# Patient Record
Sex: Female | Born: 1965 | Race: White | Hispanic: No | Marital: Married | Smoking: Never smoker
Health system: Southern US, Community
[De-identification: ages and names within clinical notes are randomized; demographics above are authoritative.]

## PROBLEM LIST (undated history)

## (undated) DIAGNOSIS — K219 Gastro-esophageal reflux disease without esophagitis: Secondary | ICD-10-CM

## (undated) DIAGNOSIS — R7303 Prediabetes: Secondary | ICD-10-CM

## (undated) DIAGNOSIS — E049 Nontoxic goiter, unspecified: Secondary | ICD-10-CM

## (undated) DIAGNOSIS — Z1379 Encounter for other screening for genetic and chromosomal anomalies: Secondary | ICD-10-CM

## (undated) DIAGNOSIS — Z8041 Family history of malignant neoplasm of ovary: Secondary | ICD-10-CM

## (undated) DIAGNOSIS — Z8742 Personal history of other diseases of the female genital tract: Secondary | ICD-10-CM

## (undated) DIAGNOSIS — N915 Oligomenorrhea, unspecified: Secondary | ICD-10-CM

## (undated) DIAGNOSIS — Z87442 Personal history of urinary calculi: Secondary | ICD-10-CM

## (undated) DIAGNOSIS — N739 Female pelvic inflammatory disease, unspecified: Secondary | ICD-10-CM

## (undated) DIAGNOSIS — F419 Anxiety disorder, unspecified: Secondary | ICD-10-CM

## (undated) DIAGNOSIS — Z803 Family history of malignant neoplasm of breast: Secondary | ICD-10-CM

## (undated) HISTORY — DX: Oligomenorrhea, unspecified: N91.5

## (undated) HISTORY — DX: Encounter for other screening for genetic and chromosomal anomalies: Z13.79

## (undated) HISTORY — DX: Family history of malignant neoplasm of ovary: Z80.41

## (undated) HISTORY — DX: Anxiety disorder, unspecified: F41.9

## (undated) HISTORY — DX: Female pelvic inflammatory disease, unspecified: N73.9

## (undated) HISTORY — DX: Gastro-esophageal reflux disease without esophagitis: K21.9

## (undated) HISTORY — DX: Personal history of other diseases of the female genital tract: Z87.42

## (undated) HISTORY — DX: Personal history of urinary calculi: Z87.442

## (undated) HISTORY — PX: DILATION AND CURETTAGE OF UTERUS: SHX78

## (undated) HISTORY — DX: Nontoxic goiter, unspecified: E04.9

## (undated) HISTORY — DX: Family history of malignant neoplasm of breast: Z80.3

## (undated) HISTORY — PX: LITHOTRIPSY: SUR834

## (undated) HISTORY — PX: WISDOM TOOTH EXTRACTION: SHX21

---

## 2007-06-17 ENCOUNTER — Ambulatory Visit: Payer: Self-pay

## 2007-08-18 ENCOUNTER — Observation Stay: Payer: Self-pay

## 2007-08-19 ENCOUNTER — Inpatient Hospital Stay: Payer: Self-pay | Admitting: Unknown Physician Specialty

## 2007-08-20 DIAGNOSIS — O24419 Gestational diabetes mellitus in pregnancy, unspecified control: Secondary | ICD-10-CM

## 2008-11-03 ENCOUNTER — Ambulatory Visit: Payer: Self-pay

## 2009-02-22 ENCOUNTER — Observation Stay: Payer: Self-pay | Admitting: Unknown Physician Specialty

## 2009-08-15 ENCOUNTER — Inpatient Hospital Stay: Payer: Self-pay | Admitting: Obstetrics and Gynecology

## 2009-08-16 DIAGNOSIS — D649 Anemia, unspecified: Secondary | ICD-10-CM

## 2010-10-04 ENCOUNTER — Ambulatory Visit: Payer: Self-pay | Admitting: Obstetrics & Gynecology

## 2010-12-31 DIAGNOSIS — N739 Female pelvic inflammatory disease, unspecified: Secondary | ICD-10-CM

## 2010-12-31 DIAGNOSIS — Z8742 Personal history of other diseases of the female genital tract: Secondary | ICD-10-CM

## 2010-12-31 HISTORY — DX: Female pelvic inflammatory disease, unspecified: N73.9

## 2010-12-31 HISTORY — DX: Personal history of other diseases of the female genital tract: Z87.42

## 2011-06-19 ENCOUNTER — Ambulatory Visit: Payer: Self-pay | Admitting: Family Medicine

## 2011-07-02 ENCOUNTER — Ambulatory Visit: Payer: Self-pay | Admitting: Urology

## 2011-07-19 ENCOUNTER — Ambulatory Visit: Payer: Self-pay | Admitting: Urology

## 2011-08-02 ENCOUNTER — Ambulatory Visit: Payer: Self-pay | Admitting: Urology

## 2011-10-09 ENCOUNTER — Ambulatory Visit: Payer: Self-pay | Admitting: Family Medicine

## 2011-10-22 ENCOUNTER — Ambulatory Visit: Payer: Self-pay | Admitting: Gastroenterology

## 2011-11-05 ENCOUNTER — Ambulatory Visit: Payer: Self-pay | Admitting: Unknown Physician Specialty

## 2011-11-14 ENCOUNTER — Ambulatory Visit: Payer: Self-pay

## 2011-11-19 ENCOUNTER — Ambulatory Visit: Payer: Self-pay | Admitting: Urology

## 2011-12-01 ENCOUNTER — Ambulatory Visit: Payer: Self-pay | Admitting: Unknown Physician Specialty

## 2011-12-20 ENCOUNTER — Ambulatory Visit: Payer: Self-pay | Admitting: Urology

## 2011-12-27 ENCOUNTER — Ambulatory Visit: Payer: Self-pay | Admitting: Urology

## 2012-01-01 HISTORY — PX: COLONOSCOPY: SHX174

## 2012-01-09 ENCOUNTER — Ambulatory Visit: Payer: Self-pay | Admitting: Urology

## 2012-03-20 ENCOUNTER — Ambulatory Visit: Payer: Self-pay | Admitting: Urology

## 2012-08-02 IMAGING — CR DG CHEST 2V
1 series · 2 of 2 positions shown · non-contrast
Comparison: none

REASON FOR EXAM: Chest Pain, SOB
COMMENTS:

PROCEDURE:     DXR - DXR CHEST PA (OR AP) AND LATERAL  - October 09, 2011  [DATE]
RESULT:     Comparison: None.

[Series 1: w chest pa · 0.14mm/px · 2 of 2 slices shown]
[im 1/2]
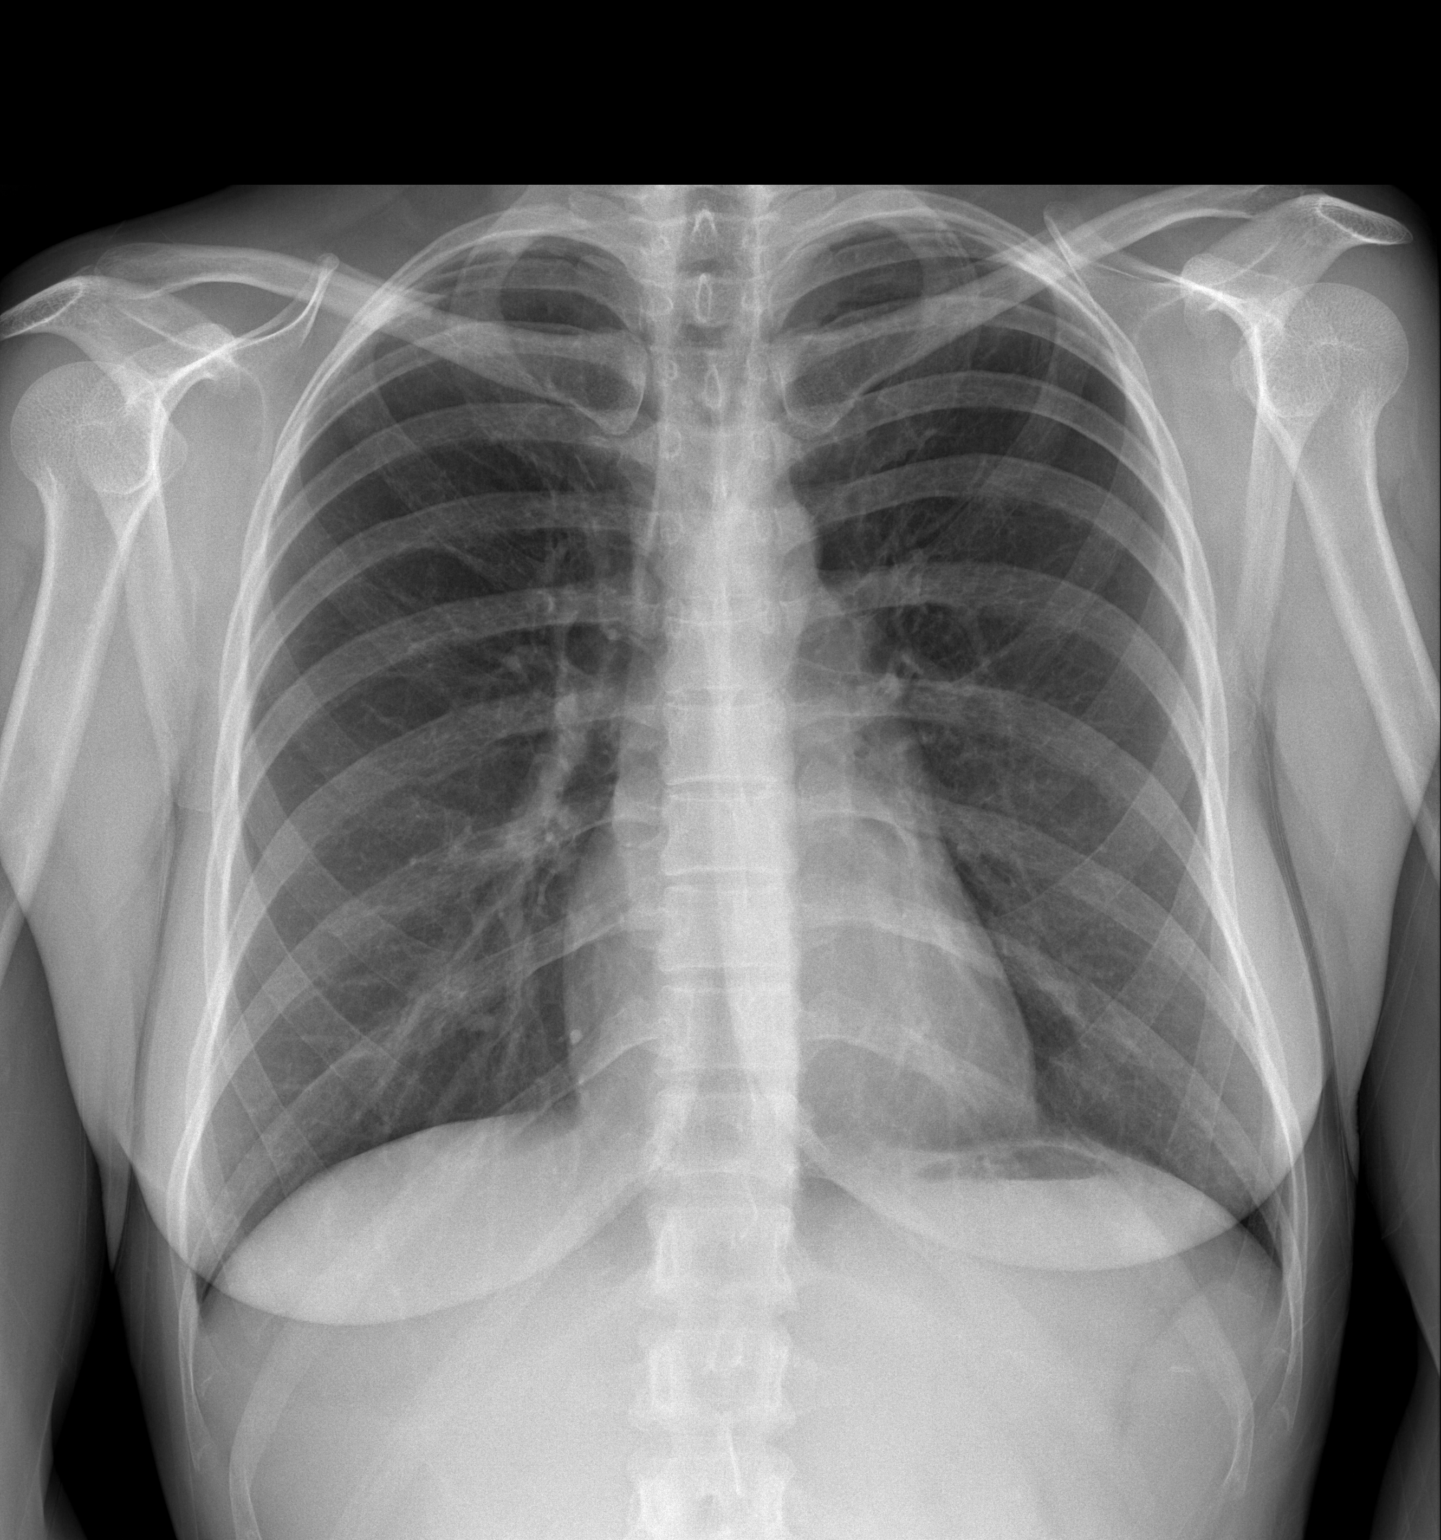
[im 2/2]
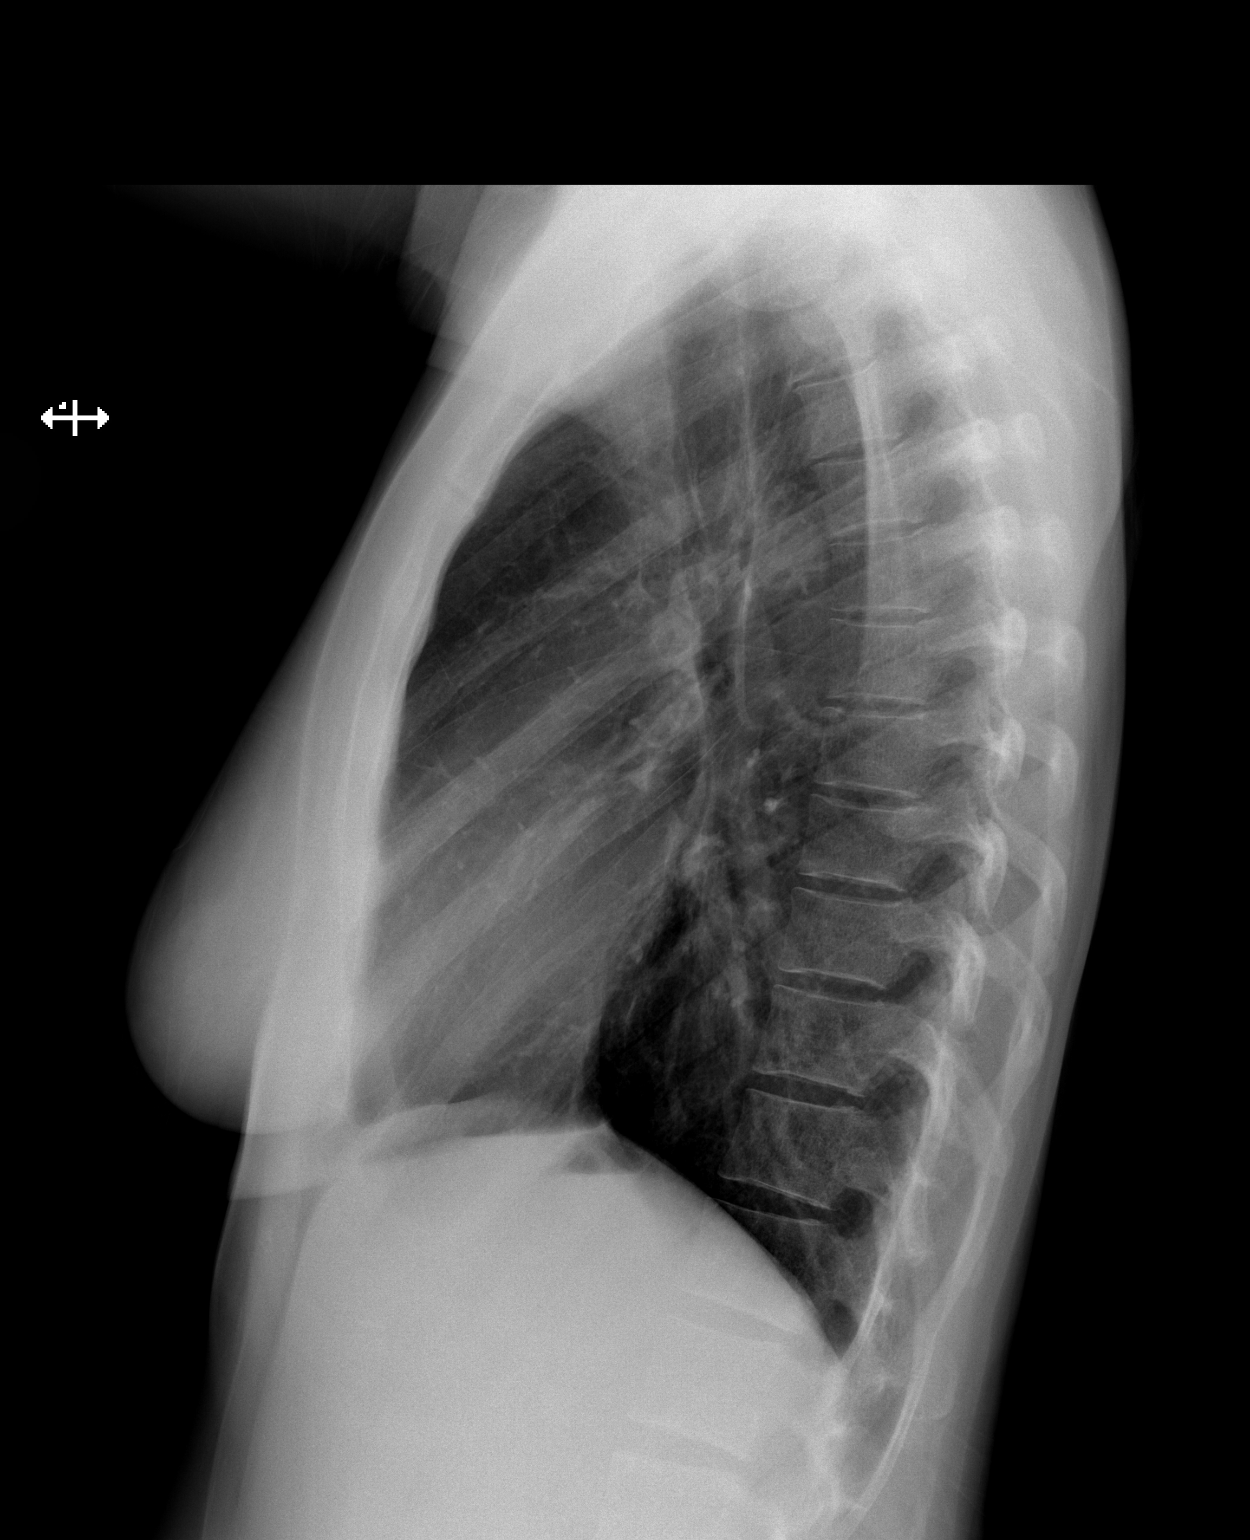

[2 of 2 positions shown; findings below may reference images not displayed]

FINDINGS: Heart and mediastinum are within normal limits. There is borderline mild
hyperinflation. No focal pulmonary opacities.
IMPRESSION: No acute cardiopulmonary disease.

## 2012-08-20 ENCOUNTER — Ambulatory Visit: Payer: Self-pay | Admitting: Urology

## 2012-11-18 ENCOUNTER — Ambulatory Visit: Payer: Self-pay

## 2012-12-01 ENCOUNTER — Ambulatory Visit: Payer: Self-pay | Admitting: Gastroenterology

## 2013-09-09 ENCOUNTER — Ambulatory Visit: Payer: Self-pay | Admitting: Urology

## 2013-11-24 ENCOUNTER — Ambulatory Visit: Payer: Self-pay

## 2014-11-29 ENCOUNTER — Ambulatory Visit: Payer: Self-pay

## 2015-11-07 ENCOUNTER — Other Ambulatory Visit: Payer: Self-pay | Admitting: Certified Nurse Midwife

## 2015-11-07 DIAGNOSIS — Z1231 Encounter for screening mammogram for malignant neoplasm of breast: Secondary | ICD-10-CM

## 2015-12-07 ENCOUNTER — Ambulatory Visit
Admission: RE | Admit: 2015-12-07 | Discharge: 2015-12-07 | Disposition: A | Discharge status: Home / Self Care | Payer: 59 | Source: Ambulatory Visit | Attending: Certified Nurse Midwife | Admitting: Certified Nurse Midwife

## 2015-12-07 ENCOUNTER — Other Ambulatory Visit: Payer: Self-pay | Admitting: Certified Nurse Midwife

## 2015-12-07 DIAGNOSIS — Z1231 Encounter for screening mammogram for malignant neoplasm of breast: Secondary | ICD-10-CM | POA: Diagnosis not present

## 2016-08-31 DIAGNOSIS — Z1379 Encounter for other screening for genetic and chromosomal anomalies: Secondary | ICD-10-CM | POA: Insufficient documentation

## 2016-08-31 HISTORY — DX: Encounter for other screening for genetic and chromosomal anomalies: Z13.79

## 2016-09-15 ENCOUNTER — Emergency Department
Admission: EM | Admit: 2016-09-15 | Discharge: 2016-09-15 | Disposition: A | Discharge status: Home / Self Care | Payer: 59 | Attending: Student in an Organized Health Care Education/Training Program | Admitting: Student in an Organized Health Care Education/Training Program

## 2016-09-15 ENCOUNTER — Encounter: Payer: Self-pay | Admitting: Emergency Medicine

## 2016-09-15 DIAGNOSIS — E860 Dehydration: Secondary | ICD-10-CM | POA: Diagnosis present

## 2016-09-15 HISTORY — DX: Prediabetes: R73.03

## 2016-09-15 LAB — COMPREHENSIVE METABOLIC PANEL
ALBUMIN: 3.7 g/dL (ref 3.5–5.0)
ALK PHOS: 52 U/L (ref 38–126)
ALT: 15 U/L (ref 14–54)
AST: 21 U/L (ref 15–41)
Anion gap: 9 (ref 5–15)
BUN: 17 mg/dL (ref 6–20)
CALCIUM: 8.9 mg/dL (ref 8.9–10.3)
CO2: 21 mmol/L — AB (ref 22–32)
CREATININE: 0.75 mg/dL (ref 0.44–1.00)
Chloride: 105 mmol/L (ref 101–111)
GFR calc Af Amer: >60 mL/min (ref >60)
GFR calc non Af Amer: >60 mL/min (ref >60)
GLUCOSE: 140 mg/dL — AB (ref 65–99)
Potassium: 3.1 mmol/L — ABNORMAL LOW (ref 3.5–5.1)
SODIUM: 135 mmol/L (ref 135–145)
Total Bilirubin: 0.1 mg/dL — ABNORMAL LOW (ref 0.3–1.2)
Total Protein: 6.6 g/dL (ref 6.5–8.1)

## 2016-09-15 LAB — CBC WITH DIFFERENTIAL/PLATELET
Basophils Absolute: 0 10*3/uL (ref 0–0.1)
Basophils Relative: 0 %
EOS PCT: 0 %
Eosinophils Absolute: 0 10*3/uL (ref 0–0.7)
HEMATOCRIT: 28.9 % — AB (ref 35.0–47.0)
Hemoglobin: 10.2 g/dL — ABNORMAL LOW (ref 12.0–16.0)
LYMPHS ABS: 1.3 10*3/uL (ref 1.0–3.6)
LYMPHS PCT: 15 %
MCH: 31.1 pg (ref 26.0–34.0)
MCHC: 35.2 g/dL (ref 32.0–36.0)
MCV: 88.4 fL (ref 80.0–100.0)
MONO ABS: 0.5 10*3/uL (ref 0.2–0.9)
Monocytes Relative: 6 %
Neutro Abs: 6.7 10*3/uL — ABNORMAL HIGH (ref 1.4–6.5)
Neutrophils Relative %: 79 %
PLATELETS: 222 10*3/uL (ref 150–440)
RBC: 3.27 MIL/uL — AB (ref 3.80–5.20)
RDW: 14.7 % — AB (ref 11.5–14.5)
WBC: 8.5 10*3/uL (ref 3.6–11.0)

## 2016-09-15 LAB — CK: Total CK: 69 U/L (ref 38–234)

## 2016-09-15 LAB — TROPONIN I
Troponin I: <0.03 ng/mL (ref <0.03)
Troponin I: <0.03 ng/mL (ref <0.03)

## 2016-09-15 MED ORDER — PROCHLORPERAZINE EDISYLATE 5 MG/ML IJ SOLN
10.0000 mg | Freq: Once | INTRAMUSCULAR | Status: AC
  Administered 2016-09-15: 10 mg via INTRAVENOUS
  Filled 2016-09-15: qty 2

## 2016-09-15 MED ORDER — SODIUM CHLORIDE 0.9 % IV BOLUS (SEPSIS)
1000.0000 mL | Freq: Once | INTRAVENOUS | Status: AC
  Administered 2016-09-15: 1000 mL via INTRAVENOUS

## 2016-09-15 MED ORDER — ONDANSETRON HCL 4 MG/2ML IJ SOLN
INTRAMUSCULAR | Status: AC
  Administered 2016-09-15: 4 mg via INTRAVENOUS
  Filled 2016-09-15: qty 2

## 2016-09-15 MED ORDER — ONDANSETRON HCL 4 MG/2ML IJ SOLN
4.0000 mg | Freq: Once | INTRAMUSCULAR | Status: AC
  Administered 2016-09-15: 4 mg via INTRAVENOUS

## 2016-09-15 MED ORDER — POTASSIUM CHLORIDE CRYS ER 20 MEQ PO TBCR
40.0000 meq | EXTENDED_RELEASE_TABLET | Freq: Once | ORAL | Status: AC
  Administered 2016-09-15: 40 meq via ORAL
  Filled 2016-09-15: qty 2

## 2016-09-15 MED ORDER — DIPHENHYDRAMINE HCL 50 MG/ML IJ SOLN
25.0000 mg | Freq: Once | INTRAMUSCULAR | Status: AC
  Administered 2016-09-15: 25 mg via INTRAVENOUS
  Filled 2016-09-15: qty 1

## 2016-09-15 NOTE — ED Notes (Signed)
Report from alicia, rn.  

## 2016-09-15 NOTE — ED Triage Notes (Signed)
Pt presents to ED via EMS. Ran 5K this morning and states she did not eat or drink much this morning prior to it. C/o headache and nausea. VSS with EMS - 130/82, P95, 98% RA, CBG 152. States she feels "weak all over." Cold a week ago. No c/o dizziness, no LOC.

## 2016-09-15 NOTE — ED Notes (Signed)
Per matt, rn. Pt ambulated without difficulty to restroom.

## 2016-09-15 NOTE — ED Provider Notes (Signed)
[facility] Emergency Department Provider Note    First MD Initiated Contact with Patient 09/15/16 1530     (approximate)  I have reviewed the triage vital signs and the nursing notes.   HISTORY  Chief Complaint Dehydration    HPI Sonia Graves is a 50 y.o. female who presents with generalized weakness as well as nausea and a mild headache this afternoon after she competed in a 5K race this morning. Patient states that she feels very dehydrated and has not been able to keep any oral hydration down today. Denies any shortness of breath or chest pain during the run. States that she is typically very active and does not have any issues with exercise. Denies any new medications. Does have a history of being prediabetic but no known cardiac disease. States that she has felt this way previously when she became very dehydrated and it improved with oral hydration   Past Medical History:  Diagnosis Date  . Prediabetes     There are no active problems to display for this patient.   Past Surgical History:  Procedure Laterality Date  . DILATION AND CURETTAGE OF UTERUS      Prior to Admission medications   Not on File    Allergies Review of patient's allergies indicates no known allergies.  Family History  Problem Relation Age of Onset  . Breast cancer Maternal Aunt     50's  . Breast cancer Cousin 60    maternal    Social History Social History  Substance Use Topics  . Smoking status: Never Smoker  . Smokeless tobacco: Never Used  . Alcohol use No    Review of Systems Patient denies headaches, rhinorrhea, blurry vision, numbness, shortness of breath, chest pain, edema, cough, abdominal pain, nausea, vomiting, diarrhea, dysuria, fevers, rashes or hallucinations unless otherwise stated above in HPI. ____________________________________________   PHYSICAL EXAM:  VITAL SIGNS: Vitals:   09/15/16 1535 09/15/16 1944  BP: (!) 142/114  (!) 113/56  Pulse: 71 68  Resp: 18 16  Temp: 97.6 F (36.4 C)     Constitutional: Alert and oriented. Dehydrated appearing Eyes: Conjunctivae are normal. PERRL. EOMI. Head: Atraumatic. Nose: No congestion/rhinnorhea. Mouth/Throat: Mucous membranes are moist.  Oropharynx non-erythematous. Neck: No stridor. Painless ROM. No cervical spine tenderness to palpation Hematological/Lymphatic/Immunilogical: No cervical lymphadenopathy. Cardiovascular: Normal rate, regular rhythm. Grossly normal heart sounds.  Good peripheral circulation. Respiratory: Normal respiratory effort.  No retractions. Lungs CTAB. Gastrointestinal: Soft and nontender. No distention. No abdominal bruits. No CVA tenderness. Musculoskeletal: No lower extremity tenderness nor edema.  No joint effusions. Neurologic:  Normal speech and language. No gross focal neurologic deficits are appreciated. No gait instability. Skin:  Skin is warm, dry and intact. No rash noted. Psychiatric: Mood and affect are normal. Speech and behavior are normal.  ____________________________________________   LABS (all labs ordered are listed, but only abnormal results are displayed)  Results for orders placed or performed during the hospital encounter of 09/15/16 (from the past 24 hour(s))  Comprehensive metabolic panel     Status: Abnormal   Collection Time: 09/15/16  3:45 PM  Result Value Ref Range   Sodium 135 135 - 145 mmol/L   Potassium 3.1 (L) 3.5 - 5.1 mmol/L   Chloride 105 101 - 111 mmol/L   CO2 21 (L) 22 - 32 mmol/L   Glucose, Bld 140 (H) 65 - 99 mg/dL   BUN 17 6 - 20 mg/dL   Creatinine, Ser 1.61 0.44 -  1.00 mg/dL   Calcium 8.9 8.9 - 11.9 mg/dL   Total Protein 6.6 6.5 - 8.1 g/dL   Albumin 3.7 3.5 - 5.0 g/dL   AST 21 15 - 41 U/L   ALT 15 14 - 54 U/L   Alkaline Phosphatase 52 38 - 126 U/L   Total Bilirubin 0.1 (L) 0.3 - 1.2 mg/dL   GFR calc non Af Amer >60 >60 mL/min   GFR calc Af Amer >60 >60 mL/min   Anion gap 9 5 - 15    Troponin I     Status: None   Collection Time: 09/15/16  3:45 PM  Result Value Ref Range   Troponin I <0.03 <0.03 ng/mL  CK     Status: None   Collection Time: 09/15/16  3:45 PM  Result Value Ref Range   Total CK 69 38 - 234 U/L  CBC with Differential/Platelet     Status: Abnormal   Collection Time: 09/15/16  3:46 PM  Result Value Ref Range   WBC 8.5 3.6 - 11.0 K/uL   RBC 3.27 (L) 3.80 - 5.20 MIL/uL   Hemoglobin 10.2 (L) 12.0 - 16.0 g/dL   HCT 14.7 (L) 82.9 - 56.2 %   MCV 88.4 80.0 - 100.0 fL   MCH 31.1 26.0 - 34.0 pg   MCHC 35.2 32.0 - 36.0 g/dL   RDW 13.0 (H) 86.5 - 78.4 %   Platelets 222 150 - 440 K/uL   Neutrophils Relative % 79 %   Neutro Abs 6.7 (H) 1.4 - 6.5 K/uL   Lymphocytes Relative 15 %   Lymphs Abs 1.3 1.0 - 3.6 K/uL   Monocytes Relative 6 %   Monocytes Absolute 0.5 0.2 - 0.9 K/uL   Eosinophils Relative 0 %   Eosinophils Absolute 0.0 0 - 0.7 K/uL   Basophils Relative 0 %   Basophils Absolute 0.0 0 - 0.1 K/uL  Troponin I     Status: None   Collection Time: 09/15/16  7:04 PM  Result Value Ref Range   Troponin I <0.03 <0.03 ng/mL   ____________________________________________  EKG My review and personal interpretation at Time: 15"39   Indication: weakness  Rate: 75  Rhythm: sinus Axis: normal Other: no acute ischemia ____________________________________________  RADIOLOGY   ____________________________________________   PROCEDURES  Procedure(s) performed: none    Critical Care performed: no ____________________________________________   INITIAL IMPRESSION / ASSESSMENT AND PLAN / ED COURSE  Pertinent labs & imaging results that were available during my care of the patient were reviewed by me and considered in my medical decision making (see chart for details).  DDX: Dehydration, electrolyte abnormality, rhabdomyolysis, heart failure, gastroenteritis, ileus  Sonia Graves is a 50 y.o. who presents to the ED with generalized weakness  and fatigue after running a 5K today. Patient also complaining of headache and nausea. Her abdominal exam is soft and benign.  He has no focal neurodeficits. EKG shows no evidence of acute ischemia or dysrhythmia. Presentation is not particular consistent with ACS and she denies any chest pain or shortness of breath and she was able to run a 5K without any symptoms. Do suspect this is secondary to dehydration as she had poor oral intake before and after the race.  She is afebrile and shows no evidence of heat induced illness. We'll provide IV hydration and check labs  The patient will be placed on continuous pulse oximetry and telemetry for monitoring.  Laboratory evaluation will be sent to evaluate for the above complaints.  Clinical Course  Comment By Time  Patient reassessed and feeling much better after IV fluids. She is tolerating oral hydration. Will replete low potassium. We'll continue to monitor. Willy EddyPatrick Lajoya Dombek, MD 09/16 1735    Patient ambulated about the room asking about discharge home. She has significant improvement in symptoms is able to tolerate oral hydration. She remains hemodynamic stable without any electrolyte abnormality. Repeat troponin negative for any ischemia. I do feel patient is appropriate for outpatient management at this time.  Have discussed with the patient and available family all diagnostics and treatments performed thus far and all questions were answered to the best of my ability. The patient demonstrates understanding and agreement with plan.    ____________________________________________   FINAL CLINICAL IMPRESSION(S) / ED DIAGNOSES  Final diagnoses:  Dehydration      NEW MEDICATIONS STARTED DURING THIS VISIT:  There are no discharge medications for this patient.    Note:  This document was prepared using Dragon voice recognition software and may include unintentional dictation errors.    Willy EddyPatrick Anwitha Mapes, MD 09/15/16 2136

## 2016-10-26 LAB — HM PAP SMEAR: HM PAP: NEGATIVE

## 2016-11-12 ENCOUNTER — Other Ambulatory Visit: Payer: Self-pay | Admitting: Certified Nurse Midwife

## 2016-11-12 DIAGNOSIS — Z1231 Encounter for screening mammogram for malignant neoplasm of breast: Secondary | ICD-10-CM

## 2016-12-10 ENCOUNTER — Ambulatory Visit
Admission: RE | Admit: 2016-12-10 | Discharge: 2016-12-10 | Disposition: A | Discharge status: Home / Self Care | Payer: 59 | Source: Ambulatory Visit | Attending: Certified Nurse Midwife | Admitting: Certified Nurse Midwife

## 2016-12-10 DIAGNOSIS — Z1231 Encounter for screening mammogram for malignant neoplasm of breast: Secondary | ICD-10-CM | POA: Insufficient documentation

## 2016-12-10 LAB — HM MAMMOGRAPHY: HM MAMMO: NORMAL (ref 0–4)

## 2017-10-03 ENCOUNTER — Encounter: Payer: Self-pay | Admitting: *Deleted

## 2017-10-04 ENCOUNTER — Encounter: Payer: Self-pay | Admitting: *Deleted

## 2017-10-04 ENCOUNTER — Ambulatory Visit
Admission: RE | Admit: 2017-10-04 | Discharge: 2017-10-04 | Disposition: A | Discharge status: Home / Self Care | Payer: 59 | Source: Ambulatory Visit | Attending: Gastroenterology | Admitting: Gastroenterology

## 2017-10-04 ENCOUNTER — Ambulatory Visit: Payer: 59 | Admitting: Anesthesiology

## 2017-10-04 ENCOUNTER — Encounter
Admission: RE | Disposition: A | Discharge status: Home / Self Care | Payer: Self-pay | Source: Ambulatory Visit | Attending: Gastroenterology

## 2017-10-04 DIAGNOSIS — Z8371 Family history of colonic polyps: Secondary | ICD-10-CM | POA: Insufficient documentation

## 2017-10-04 DIAGNOSIS — Z79899 Other long term (current) drug therapy: Secondary | ICD-10-CM | POA: Insufficient documentation

## 2017-10-04 DIAGNOSIS — Z1211 Encounter for screening for malignant neoplasm of colon: Secondary | ICD-10-CM | POA: Insufficient documentation

## 2017-10-04 DIAGNOSIS — R7303 Prediabetes: Secondary | ICD-10-CM | POA: Insufficient documentation

## 2017-10-04 DIAGNOSIS — K621 Rectal polyp: Secondary | ICD-10-CM | POA: Insufficient documentation

## 2017-10-04 HISTORY — PX: COLONOSCOPY WITH PROPOFOL: SHX5780

## 2017-10-04 LAB — POCT PREGNANCY, URINE: PREG TEST UR: NEGATIVE

## 2017-10-04 SURGERY — COLONOSCOPY WITH PROPOFOL
Anesthesia: General

## 2017-10-04 MED ORDER — PROPOFOL 10 MG/ML IV BOLUS
INTRAVENOUS | Status: AC
  Filled 2017-10-04: qty 20

## 2017-10-04 MED ORDER — MIDAZOLAM HCL 2 MG/2ML IJ SOLN
INTRAMUSCULAR | Status: AC
  Filled 2017-10-04: qty 2

## 2017-10-04 MED ORDER — FENTANYL CITRATE (PF) 100 MCG/2ML IJ SOLN
INTRAMUSCULAR | Status: AC
  Filled 2017-10-04: qty 2

## 2017-10-04 MED ORDER — LIDOCAINE HCL (PF) 2 % IJ SOLN
INTRAMUSCULAR | Status: AC
  Filled 2017-10-04: qty 10

## 2017-10-04 MED ORDER — PROPOFOL 500 MG/50ML IV EMUL
INTRAVENOUS | Status: AC
  Filled 2017-10-04: qty 50

## 2017-10-04 MED ORDER — SODIUM CHLORIDE 0.9 % IV SOLN: INTRAVENOUS | Status: DC

## 2017-10-04 MED ORDER — MIDAZOLAM HCL 2 MG/2ML IJ SOLN
INTRAMUSCULAR | Status: DC | PRN
  Administered 2017-10-04: 2 mg via INTRAVENOUS

## 2017-10-04 MED ORDER — SODIUM CHLORIDE 0.9 % IV SOLN
INTRAVENOUS | Status: DC
  Administered 2017-10-04 (×3): via INTRAVENOUS

## 2017-10-04 MED ORDER — FENTANYL CITRATE (PF) 100 MCG/2ML IJ SOLN
INTRAMUSCULAR | Status: DC | PRN
  Administered 2017-10-04: 50 ug via INTRAVENOUS

## 2017-10-04 MED ORDER — PHENYLEPHRINE HCL 10 MG/ML IJ SOLN
INTRAMUSCULAR | Status: DC | PRN
  Administered 2017-10-04 (×2): 100 ug via INTRAVENOUS

## 2017-10-04 MED ORDER — PROPOFOL 500 MG/50ML IV EMUL
INTRAVENOUS | Status: DC | PRN
  Administered 2017-10-04: 150 ug/kg/min via INTRAVENOUS

## 2017-10-04 MED ORDER — LIDOCAINE HCL (CARDIAC) 20 MG/ML IV SOLN
INTRAVENOUS | Status: DC | PRN
  Administered 2017-10-04: 60 mg via INTRATRACHEAL

## 2017-10-04 MED ORDER — PROPOFOL 10 MG/ML IV BOLUS
INTRAVENOUS | Status: DC | PRN
  Administered 2017-10-04: 50 mg via INTRAVENOUS

## 2017-10-04 NOTE — Anesthesia Postprocedure Evaluation (Signed)
Anesthesia Post Note  Patient: Sonia Graves  Procedure(s) Performed: COLONOSCOPY WITH PROPOFOL (N/A )  Patient location during evaluation: Endoscopy Anesthesia Type: General Level of consciousness: awake and alert Pain management: pain level controlled Vital Signs Assessment: post-procedure vital signs reviewed and stable Respiratory status: spontaneous breathing, nonlabored ventilation, respiratory function stable and patient connected to nasal cannula oxygen Cardiovascular status: blood pressure returned to baseline and stable Postop Assessment: no apparent nausea or vomiting Anesthetic complications: no     Last Vitals:  Vitals:   10/04/17 1546 10/04/17 1556  BP: 93/68 96/60  Pulse:    Resp:    Temp:    SpO2:      Last Pain:  Vitals:   10/04/17 1526  TempSrc: Tympanic                 Lenard Simmer

## 2017-10-04 NOTE — Transfer of Care (Signed)
Immediate Anesthesia Transfer of Care Note  Patient: Sonia Graves  Procedure(s) Performed: COLONOSCOPY WITH PROPOFOL (N/A )  Patient Location: Endoscopy Unit  Anesthesia Type:General  Level of Consciousness: drowsy and patient cooperative  Airway & Oxygen Therapy: Patient Spontanous Breathing and Patient connected to nasal cannula oxygen  Post-op Assessment: Report given to RN and Post -op Vital signs reviewed and stable  Post vital signs: Reviewed and stable  Last Vitals:  Vitals:   10/04/17 1316 10/04/17 1526  BP: (!) 97/58 (!) 90/42  Pulse: 78 74  Resp: 16 14  Temp: 36.6 C (!) 36.4 C  SpO2: 100% 100%    Last Pain:  Vitals:   10/04/17 1526  TempSrc: Tympanic         Complications: No apparent anesthesia complications

## 2017-10-04 NOTE — Anesthesia Preprocedure Evaluation (Signed)
Anesthesia Evaluation  Patient identified by MRN, date of birth, ID band Patient awake    Reviewed: Allergy & Precautions, NPO status , Patient's Chart, lab work & pertinent test results  History of Anesthesia Complications Negative for: history of anesthetic complications  Airway Mallampati: II  TM Distance: >3 FB Neck ROM: Full    Dental no notable dental hx.    Pulmonary neg pulmonary ROS, neg sleep apnea, neg COPD,    breath sounds clear to auscultation- rhonchi (-) wheezing      Cardiovascular Exercise Tolerance: Good (-) hypertension(-) CAD and (-) Past MI  Rhythm:Regular Rate:Normal - Systolic murmurs and - Diastolic murmurs    Neuro/Psych negative neurological ROS  negative psych ROS   GI/Hepatic negative GI ROS, Neg liver ROS,   Endo/Other  negative endocrine ROSneg diabetes  Renal/GU negative Renal ROS     Musculoskeletal negative musculoskeletal ROS (+)   Abdominal (+) - obese,   Peds  Hematology negative hematology ROS (+)   Anesthesia Other Findings   Reproductive/Obstetrics                             Anesthesia Physical Anesthesia Plan  ASA: I  Anesthesia Plan: General   Post-op Pain Management:    Induction: Intravenous  PONV Risk Score and Plan: 2 and Propofol infusion  Airway Management Planned: Natural Airway  Additional Equipment:   Intra-op Plan:   Post-operative Plan:   Informed Consent: I have reviewed the patients History and Physical, chart, labs and discussed the procedure including the risks, benefits and alternatives for the proposed anesthesia with the patient or authorized representative who has indicated his/her understanding and acceptance.     Dental advisory given  Plan Discussed with: CRNA and Anesthesiologist  Anesthesia Plan Comments:         Anesthesia Quick Evaluation  

## 2017-10-04 NOTE — H&P (Signed)
Outpatient short stay form Pre-procedure 10/04/2017 2:41 PM Lollie Sails MD  Primary Physician: Dr. Adella Hare  Reason for visit:  Colonoscopy  History of present illness:  Patient is a 51 year old female presenting today as above. There is a family history of colon polyps in both of her parents. Her last colonoscopy was in 2013 for Hemoccult-positive stool that was otherwise negative. He has no issues with diarrhea. She tolerated her prep well. She takes no aspirin or blood thinning agents.    Current Facility-Administered Medications:  .  0.9 %  sodium chloride infusion, , Intravenous, Continuous, Lollie Sails, MD, Last Rate: 20 mL/hr at 10/04/17 1339 .  0.9 %  sodium chloride infusion, , Intravenous, Continuous, Lollie Sails, MD  Prescriptions Prior to Admission  Medication Sig Dispense Refill Last Dose  . blood glucose meter kit and supplies by Other route as directed. Dispense based on patient and insurance preference. Use up to four times daily as directed. (FOR ICD-9 250.00, 250.01).     . polyethylene glycol powder (GLYCOLAX/MIRALAX) powder Take 1 Container by mouth once.     . pantoprazole (PROTONIX) 40 MG tablet Take 40 mg by mouth daily.   Not Taking at Unknown time     No Known Allergies   Past Medical History:  Diagnosis Date  . Prediabetes     Review of systems:      Physical Exam    Heart and lungs: Regular rate and rhythm without rub or gallop, lungs are bilaterally clear.    HEENT: Normocephalic atraumatic eyes are anicteric    Other:     Pertinant exam for procedure: Soft nontender nondistended bowel sounds positive normoactive.    Planned proceedures: Colonoscopy and indicated procedures. I have discussed the risks benefits and complications of procedures to include not limited to bleeding, infection, perforation and the risk of sedation and the patient wishes to proceed.    Lollie Sails, MD Gastroenterology 10/04/2017  2:41  PM

## 2017-10-04 NOTE — Anesthesia Post-op Follow-up Note (Signed)
Anesthesia QCDR form completed.        

## 2017-10-04 NOTE — Op Note (Signed)
[facility] Gastroenterology Patient Name: Sonia Graves Procedure Date: 10/04/2017 2:43 PM MRN: [id number] Account #: [id number] Date of Birth: [birth year] Admit Type: Outpatient Age: 51 Room: [facility] ENDO ROOM 4 Gender: Female Note Status: Finalized Procedure:            Colonoscopy Indications:          Family history of colonic polyps in a first-degree                        relative Providers:            Christena Deem, MD Referring MD:         No Local Md, MD (Referring MD) Medicines:            Monitored Anesthesia Care Complications:        No immediate complications. Procedure:            Pre-Anesthesia Assessment:                       - ASA Grade Assessment: I - A normal, healthy patient.                       After obtaining informed consent, the colonoscope was                        passed under direct vision. Throughout the procedure,                        the patient's blood pressure, pulse, and oxygen                        saturations were monitored continuously. The Olympus                        PCF-H180AL colonoscope ( S#: [id number] ) was introduced                        through the anus and advanced to the the cecum,                        identified by appendiceal orifice and ileocecal valve.                        The quality of the bowel preparation was good. Findings:      A 1 mm polyp was found in the distal descending colon. The polyp was       sessile. The polyp was removed with a cold biopsy forceps. Resection and       retrieval were complete.      A 3 mm polyp was found in the rectum. The polyp was sessile. The polyp       was removed with a cold biopsy forceps. Resection and retrieval were       complete.      The digital rectal exam was normal. Impression:           - One 1 mm polyp in the distal descending colon,                        removed with a cold biopsy forceps. Resected and  retrieved.                 - One 3 mm polyp in the rectum, removed with a cold                        biopsy forceps. Resected and retrieved. Recommendation:       - Discharge patient to home.                       - Await pathology results.                       - Telephone GI clinic for pathology results in 1 week. Procedure Code(s):    --- Professional ---                       [phone], Colonoscopy, flexible; with biopsy, single or                        multiple Diagnosis Code(s):    --- Professional ---                       D12.4, Benign neoplasm of descending colon                       K62.1, Rectal polyp                       Z83.71, Family history of colonic polyps CPT copyright 2016 [employer]. All rights reserved. The codes documented in this report are preliminary and upon coder review may  be revised to meet current compliance requirements. Christena Deem, MD 10/04/2017 3:24:03 PM This report has been signed electronically. Number of Addenda: 0 Note Initiated On: 10/04/2017 2:43 PM Scope Withdrawal Time: 0 hours 12 minutes 48 seconds  Total Procedure Duration: 0 hours 28 minutes 8 seconds       [facility]

## 2017-10-07 ENCOUNTER — Encounter: Payer: Self-pay | Admitting: Gastroenterology

## 2017-10-08 LAB — SURGICAL PATHOLOGY

## 2017-10-31 ENCOUNTER — Encounter: Payer: Self-pay | Admitting: Certified Nurse Midwife

## 2017-10-31 ENCOUNTER — Ambulatory Visit (INDEPENDENT_AMBULATORY_CARE_PROVIDER_SITE_OTHER): Payer: 59 | Admitting: Certified Nurse Midwife

## 2017-10-31 VITALS — BP 104/62 | HR 78 | Ht 60.0 in | Wt 118.0 lb

## 2017-10-31 DIAGNOSIS — F419 Anxiety disorder, unspecified: Secondary | ICD-10-CM

## 2017-10-31 DIAGNOSIS — Z1379 Encounter for other screening for genetic and chromosomal anomalies: Secondary | ICD-10-CM

## 2017-10-31 DIAGNOSIS — Z23 Encounter for immunization: Secondary | ICD-10-CM | POA: Diagnosis not present

## 2017-10-31 DIAGNOSIS — Z1329 Encounter for screening for other suspected endocrine disorder: Secondary | ICD-10-CM

## 2017-10-31 DIAGNOSIS — Z124 Encounter for screening for malignant neoplasm of cervix: Secondary | ICD-10-CM

## 2017-10-31 DIAGNOSIS — Z8041 Family history of malignant neoplasm of ovary: Secondary | ICD-10-CM | POA: Diagnosis not present

## 2017-10-31 DIAGNOSIS — Z803 Family history of malignant neoplasm of breast: Secondary | ICD-10-CM

## 2017-10-31 DIAGNOSIS — Z1231 Encounter for screening mammogram for malignant neoplasm of breast: Secondary | ICD-10-CM | POA: Diagnosis not present

## 2017-10-31 DIAGNOSIS — Z87898 Personal history of other specified conditions: Secondary | ICD-10-CM

## 2017-10-31 DIAGNOSIS — Z1239 Encounter for other screening for malignant neoplasm of breast: Secondary | ICD-10-CM

## 2017-10-31 NOTE — Progress Notes (Signed)
Gynecology Annual Exam  PCP: Patient, No Pcp Per  Chief Complaint:  Chief Complaint  Patient presents with  . Gynecologic Exam    History of Present Illness:Sonia Graves is a 51 year old Caucasian/White female , G 3 P 2 0 1 2 , who presents for her annual exam. She is not having any gyn concerns.  Her menses were irregular for the last half of 2017, but have returned to coming monthly and  lasting 5 days with a  medium flow. Mild hot flashes have abated. She does admit to some occasional headaches and irritability with menses. No IMB  She denies dysmenorrhea.  The patient's past medical history is notable for a history of glucose intolerance and gestational diabetes..  Since her last annual GYN exam dated 10/26/2016, she has had a colonoscopy for a family history of colon polyps which was normal. Her next colonoscopy is due in 5 years.  She desires hemoglobin A1C testing.  She is sexually active. She is currently using a vasectomy for contraception.  Her most recent pap smear was obtained 10/26/16 and was negative.  Her most recent mammogram obtained on 12/10/2016 was normal.  There is a positive history of breast cancer in her maternal cousin and maternal aunt . Genetic testing has been done. The patient had MYRISK testing 2017 that was negative. Patient's IBIS risk calculated in 2017 for breast cancer is 13.4%.  There is a family history of ovarian cancer in her paternal aunt. Genetic testing has been done. She tested negative for MYRISK..  The patient does do occasional self breast exams.  The patient does not smoke.  The patient does drink infrequently.  The patient does not use illegal drugs.  The patient usu exercises regularly.  The patient does get adequate calcium in her diet and with her supplement.  She had a recent cholesterol screen in 2018 that was normal.     The patient denies current symptoms of depression.    Review of Systems: Review of Systems    Constitutional: Negative for chills, fever and weight loss.  HENT: Negative for congestion, sinus pain and sore throat.   Eyes: Negative for blurred vision and pain.  Respiratory: Negative for hemoptysis, shortness of breath and wheezing.   Cardiovascular: Positive for palpitations (occasional). Negative for chest pain and leg swelling.  Gastrointestinal: Negative for abdominal pain, blood in stool, diarrhea, heartburn, nausea and vomiting.  Genitourinary: Negative for dysuria, frequency, hematuria and urgency.  Musculoskeletal: Negative for back pain, joint pain and myalgias.  Skin: Negative for itching and rash.  Neurological: Negative for dizziness, tingling and headaches.  Endo/Heme/Allergies: Negative for environmental allergies and polydipsia. Does not bruise/bleed easily.       Negative for hirsutism   Psychiatric/Behavioral: Negative for depression. The patient is not nervous/anxious and does not have insomnia.     Past Medical History:  Past Medical History:  Diagnosis Date  . Anxiety   . Esophageal reflux   . Family history of breast cancer    IBIS risk 13.4% by [facility]   . Family history of ovarian cancer   . Genetic testing of female 08/2016   MyRisk Negative  . Goiter   . History of abnormal cervical Pap smear 2012   ascus  . History of kidney stones   . Inflammatory disease of cervix, vagina, and vulva 2012  . Oligomenorrhea   . Prediabetes     Past Surgical History:  Past Surgical History:  Procedure Laterality  Date  . COLONOSCOPY  2013   had heme positive FIT test  . COLONOSCOPY WITH PROPOFOL N/A 10/04/2017   Procedure: COLONOSCOPY WITH PROPOFOL;  Surgeon: Lollie Sails, MD;  Location: [facility] ENDOSCOPY;  Service: Endoscopy;  Laterality: N/A;  . DILATION AND CURETTAGE OF UTERUS    . LITHOTRIPSY    . WISDOM TOOTH EXTRACTION      Family History:  Family History  Problem Relation Age of Onset  . Breast cancer Maternal Aunt 74       died from breast  cancer  . Diabetes Maternal Aunt        maternal aunts x3  . Heart disease Maternal Aunt   . Breast cancer Cousin 60       maternal  . Diabetes Cousin   . Heart disease Mother        mitral valve replacement  . Hypertension Mother   . Thyroid disease Mother   . Squamous cell carcinoma Mother 53  . Prostate cancer Father 63  . Esophageal cancer Maternal Grandmother   . Colon cancer Paternal Uncle 79  . Ovarian cancer Paternal Aunt 68  . Pancreatic cancer Cousin        son of aunt with ovarian cancer    Social History:  Social History   Socioeconomic History  . Marital status: Married    Spouse name: Not on file  . Number of children: 2  . Years of education: Not on file  . Highest education level: Bachelor's degree (e.g., BA, AB, BS)  Social Needs  . Financial resource strain: Not on file  . Food insecurity - worry: Not on file  . Food insecurity - inability: Not on file  . Transportation needs - medical: Not on file  . Transportation needs - non-medical: Not on file  Occupational History  . Occupation: Homemaker/ Part time IT  Tobacco Use  . Smoking status: Never Smoker  . Smokeless tobacco: Never Used  Substance and Sexual Activity  . Alcohol use: Yes    Comment: occasional  . Drug use: No  . Sexual activity: Yes    Birth control/protection: Other-see comments    Comment: vasectomy   Other Topics Concern  . Not on file  Social History Narrative  . Not on file    Allergies:  No Known Allergies  Medications: Current Outpatient Medications on File Prior to Visit  Medication Sig Dispense Refill  . Blood Glucose Monitoring Suppl (FIFTY50 GLUCOSE METER 2.0) w/Device KIT Use as directed.    . Clocortolone Pivalate (CLODERM) 0.1 % cream     . glucose blood test strip      No current facility-administered medications on file prior to visit.   Multivitamin Vitamin D3   Physical Exam Vitals: BP 104/62   Pulse 78   Ht 5' (1.524 m)   Wt 118 lb (53.5 kg)    LMP 10/25/2017 (Exact Date)   BMI 23.05 kg/m   General: WF in NAD HEENT: normocephalic, anicteric Neck: no thyroid enlargement, no palpable nodules, no cervical lymphadenopathy  Pulmonary: No increased work of breathing, CTAB Cardiovascular: RRR, without murmur  Breast: Breast symmetrical, no tenderness, no palpable nodules or masses, no skin or nipple retraction present, no nipple discharge.  No axillary, infraclavicular or supraclavicular lymphadenopathy. Abdomen: Soft, non-tender, non-distended.  Umbilicus without lesions.  No hepatomegaly or masses palpable. No evidence of hernia. Genitourinary:  External: Normal external female genitalia.  Normal urethral meatus, normal Bartholin's and Skene's glands.    Vagina: Normal  vaginal mucosa, no evidence of prolapse.    Cervix: Grossly normal in appearance, no bleeding, non-tender  Uterus: Anteverted, normal size, shape, and consistency, mobile, and non-tender  Adnexa: No adnexal masses, non-tender  Rectal: deferred  Lymphatic: no evidence of inguinal lymphadenopathy Extremities: no edema, erythema, or tenderness Neurologic: Grossly intact Psychiatric: mood appropriate, affect full     Assessment: 51 y.o. annual gyn exam.  Plan:  1) Breast cancer screening - recommend monthly self breast exams and annual screening mammograms. Mammogram was ordered today. Patient to call for appt at [facility]  2) Colonoscopy-UTD. Next due in 5 years  3) Cervical cancer screening - Pap was done.  4) Osteoporosis-discussed calcium and vitamin D3 requirements. DIscussed perimenopausal bleeding changes  5) TSH and hemoglobin A1C ordered. Flu vaccine today  6) RTO 1 year and prn.   Dalia Heading, CNM

## 2017-11-05 LAB — IGP, APTIMA HPV
HPV APTIMA: NEGATIVE
PAP SMEAR COMMENT: 0

## 2017-11-26 ENCOUNTER — Encounter: Payer: Self-pay | Admitting: Certified Nurse Midwife

## 2017-11-26 DIAGNOSIS — Z87442 Personal history of urinary calculi: Secondary | ICD-10-CM | POA: Insufficient documentation

## 2017-11-26 DIAGNOSIS — R7303 Prediabetes: Secondary | ICD-10-CM | POA: Insufficient documentation

## 2017-11-26 DIAGNOSIS — Z8041 Family history of malignant neoplasm of ovary: Secondary | ICD-10-CM | POA: Insufficient documentation

## 2017-11-26 DIAGNOSIS — Z803 Family history of malignant neoplasm of breast: Secondary | ICD-10-CM | POA: Insufficient documentation

## 2017-11-26 DIAGNOSIS — F419 Anxiety disorder, unspecified: Secondary | ICD-10-CM | POA: Insufficient documentation

## 2017-11-26 DIAGNOSIS — K219 Gastro-esophageal reflux disease without esophagitis: Secondary | ICD-10-CM | POA: Insufficient documentation

## 2017-12-16 ENCOUNTER — Ambulatory Visit
Admission: RE | Admit: 2017-12-16 | Discharge: 2017-12-16 | Disposition: A | Discharge status: Home / Self Care | Payer: 59 | Source: Ambulatory Visit | Attending: Certified Nurse Midwife | Admitting: Certified Nurse Midwife

## 2017-12-16 DIAGNOSIS — Z1239 Encounter for other screening for malignant neoplasm of breast: Secondary | ICD-10-CM

## 2017-12-16 DIAGNOSIS — Z1231 Encounter for screening mammogram for malignant neoplasm of breast: Secondary | ICD-10-CM | POA: Diagnosis present

## 2018-04-21 ENCOUNTER — Other Ambulatory Visit: Payer: Managed Care, Other (non HMO)

## 2018-04-21 DIAGNOSIS — Z87898 Personal history of other specified conditions: Secondary | ICD-10-CM

## 2018-04-21 DIAGNOSIS — Z1329 Encounter for screening for other suspected endocrine disorder: Secondary | ICD-10-CM

## 2018-04-22 LAB — TSH: TSH: 0.841 u[IU]/mL (ref 0.450–4.500)

## 2018-04-22 LAB — HGB A1C W/O EAG: HEMOGLOBIN A1C: 5.5 % (ref 4.8–5.6)

## 2018-04-28 ENCOUNTER — Encounter (INDEPENDENT_AMBULATORY_CARE_PROVIDER_SITE_OTHER): Payer: Self-pay

## 2018-11-19 ENCOUNTER — Ambulatory Visit: Payer: Managed Care, Other (non HMO) | Admitting: Certified Nurse Midwife

## 2018-11-23 NOTE — Progress Notes (Addendum)
Gynecology Annual Exam  PCP: Patient, No Pcp Per  Chief Complaint:  Chief Complaint  Patient presents with  . Gynecologic Exam    would like flu shot today    History of Present Illness:Sonia Graves is a 52 year old Caucasian/White female , G 3 P 2 0 1 2 , who presents for her annual exam. She is not having any gyn concerns.  Her menses are irregular, are every 4-6 weeks, and last 4-5 days with a  medium flow. Mild hot flashes have abated. She does admit to some premenstraul headaches, breast tenderness and irritability with menses. No IMB. Her LMP was 10/31/2018 She denies dysmenorrhea.  The patient's past medical history is notable for a history of glucose intolerance and gestational diabetes.. Her last hemoglobin A1C in August  2019 was 5.7%. Would like another hemoglobin A1C early next year.  Since her last annual GYN exam dated 10/31/2017, she has been diagnosed with seborrheic dermatitis and was given a steroid cream for her nose and eyebrows. Has also noticed a spot on her vulva that itches.  She had a colonoscopy for a family history of colon polyps in 10/04/2017. Had one hyperplastic polyp removed.  Her next colonoscopy is due in 5 years.  She is sexually active. She is currently using a vasectomy for contraception.  Her most recent pap smear was obtained 10/31/2017 and was NIL/negative HRHPV. Her most recent mammogram obtained on 12/16/2017 was normal Birad 1 c There is a positive history of breast cancer in her maternal cousin and maternal aunt . Genetic testing has been done. The patient had MYRISK testing 2017 that was negative. Patient's IBIS risk calculated in 2017 for breast cancer is 13.4%.  There is a family history of ovarian cancer in her paternal aunt. Genetic testing has been done. The patient  tested negative for MYRISK.  The patient does do occasional self breast exams.  The patient does not smoke.  The patient does drink infrequently.  The patient does not  use illegal drugs.  The patient has been exercising by walking. Trying to incorporate more exercise into her routine. The patient does get adequate calcium in her diet. She had a recent cholesterol screen in 2019 that was borderline elevated with a normal HDL=69  She also found out she was not immune to rubeola and mumps.    The patient denies current symptoms of depression.    Review of Systems: Review of Systems  Constitutional: Negative for chills, fever and weight loss.  HENT: Negative for congestion, sinus pain and sore throat.   Eyes: Negative for blurred vision and pain.  Respiratory: Negative for hemoptysis, shortness of breath and wheezing.   Cardiovascular: Negative for chest pain and leg swelling. Palpitations: occasional.  Gastrointestinal: Negative for abdominal pain, blood in stool, diarrhea, heartburn, nausea and vomiting.  Genitourinary: Negative for dysuria, frequency, hematuria and urgency.  Musculoskeletal: Negative for back pain, joint pain and myalgias.  Skin: Negative for itching and rash.  Neurological: Negative for dizziness, tingling and headaches.  Endo/Heme/Allergies: Negative for environmental allergies and polydipsia. Does not bruise/bleed easily.       Negative for hirsutism   Psychiatric/Behavioral: Negative for depression. The patient is not nervous/anxious and does not have insomnia.   Breasts: positive for tenderness  Past Medical History:  Past Medical History:  Diagnosis Date  . Anxiety   . Esophageal reflux   . Family history of breast cancer    IBIS risk 13.4% by [facility]   .  Family history of ovarian cancer   . Genetic testing of female 08/2016   MyRisk Negative  . Goiter   . History of abnormal cervical Pap smear 2012   ascus  . History of kidney stones   . Inflammatory disease of cervix, vagina, and vulva 2012  . Oligomenorrhea   . Prediabetes     Past Surgical History:  Past Surgical History:  Procedure Laterality Date  .  COLONOSCOPY  2013   had heme positive FIT test  . COLONOSCOPY WITH PROPOFOL N/A 10/04/2017   Procedure: COLONOSCOPY WITH PROPOFOL;  Surgeon: Lollie Sails, MD;  Location: [facility] ENDOSCOPY;  Service: Endoscopy;  Laterality: N/A;  . DILATION AND CURETTAGE OF UTERUS    . LITHOTRIPSY    . WISDOM TOOTH EXTRACTION      Family History:  Family History  Problem Relation Age of Onset  . Breast cancer Maternal Aunt 12       died from breast cancer  . Diabetes Maternal Aunt        TYPE2  . Heart disease Maternal Aunt   . Breast cancer Cousin 60       maternal  . Diabetes Cousin        TYPE1  . Heart disease Mother        mitral valve replacement  . Hypertension Mother   . Thyroid disease Mother   . Squamous cell carcinoma Mother 69  . Cancer Mother 28       BOWEN'S DISEASE  . Prostate cancer Father 79  . Esophageal cancer Maternal Grandmother   . Colon cancer Paternal Uncle 74  . Ovarian cancer Paternal Aunt 18       + BRCA 2  . Pancreatic cancer Cousin        son of aunt with ovarian cancer  . Diabetes Maternal Aunt        TYPE2  . Diabetes Maternal Aunt        TYPE2  . Melanoma Cousin        daughter of aunt with ovarian cancer    Social History:  Social History   Socioeconomic History  . Marital status: Married    Spouse name: Not on file  . Number of children: 2  . Years of education: 16  . Highest education level: Bachelor's degree (e.g., BA, AB, BS)  Occupational History  . Occupation: Homemaker/ Part time IT  Social Needs  . Financial resource strain: Not on file  . Food insecurity:    Worry: Not on file    Inability: Not on file  . Transportation needs:    Medical: Not on file    Non-medical: Not on file  Tobacco Use  . Smoking status: Never Smoker  . Smokeless tobacco: Never Used  Substance and Sexual Activity  . Alcohol use: Yes    Comment: occasional  . Drug use: No  . Sexual activity: Yes    Birth control/protection: Other-see comments     Comment: vasectomy   Lifestyle  . Physical activity:    Days per week: Not on file    Minutes per session: Not on file  . Stress: Not on file  Relationships  . Social connections:    Talks on phone: Not on file    Gets together: Not on file    Attends religious service: Not on file    Active member of club or organization: Not on file    Attends meetings of clubs or organizations: Not on file  Relationship status: Not on file  . Intimate partner violence:    Fear of current or ex partner: Not on file    Emotionally abused: Not on file    Physically abused: Not on file    Forced sexual activity: Not on file  Other Topics Concern  . Not on file  Social History Narrative  . Not on file    Allergies:  No Known Allergies  Medications: Current Outpatient Medications on File Prior to Visit  Medication Sig Dispense Refill  . Clocortolone Pivalate (CLODERM) 0.1 % cream      No current facility-administered medications on file prior to visit.   Multivitamin Vitamin D3   Physical Exam Vitals: BP (!) 120/50   Pulse 90   Ht 5' (1.524 m)   Wt 125 lb (56.7 kg)   LMP 10/31/2018 (Exact Date)   BMI 24.41 kg/m   General: WF in NAD HEENT: normocephalic, anicteric Neck: no thyroid enlargement, no palpable nodules, no cervical lymphadenopathy  Pulmonary: No increased work of breathing, CTAB Cardiovascular: RRR, without murmur  Breast: Breast symmetrical, no tenderness, no palpable nodules or masses, no skin or nipple retraction present, no nipple discharge.  No axillary, infraclavicular or supraclavicular lymphadenopathy. Abdomen: Soft, non-tender, non-distended.  Umbilicus without lesions.  No hepatomegaly or masses palpable. No evidence of hernia. Genitourinary:  External: Erythema of labia majora and small irritated macular lesion on left lower labia majora.  Normal urethral meatus, normal Bartholin's and Skene's glands.    Vagina: Normal vaginal mucosa, no evidence of  prolapse.    Cervix: Grossly normal in appearance, no bleeding, non-tender  Uterus: Anteverted, normal size, shape, and consistency, mobile, and non-tender  Adnexa: No adnexal masses, non-tender  Rectal: deferred  Lymphatic: no evidence of inguinal lymphadenopathy Extremities: no edema, erythema, or tenderness Neurologic: Grossly intact Psychiatric: mood appropriate, affect full     Assessment: 52 y.o. annual gyn exam. Vulvar irritation Elevated hemoglobin A1C/ prediabetes.  Plan:  1) Breast cancer screening - recommend monthly self breast exams and annual screening mammograms. Mammogram was ordered today. Patient to call for appt at [facility]  2) Colonoscopy-UTD. Next due in 5 years  3) Cervical cancer screening - Pap was done.  4) Osteoporosis-discussed calcium and vitamin D3 requirements. DIscussed perimenopausal bleeding changes  5) Flu vaccine today. Hemoglobin a1C early next year. RX for MMR given. RX for kenalog 0.1 % ointment -can apply to vulva BID prn.   6) RTO 1 year and prn.   Dalia Heading, CNM

## 2018-11-24 ENCOUNTER — Encounter: Payer: Self-pay | Admitting: Certified Nurse Midwife

## 2018-11-24 ENCOUNTER — Ambulatory Visit (INDEPENDENT_AMBULATORY_CARE_PROVIDER_SITE_OTHER): Payer: Managed Care, Other (non HMO) | Admitting: Certified Nurse Midwife

## 2018-11-24 VITALS — BP 120/50 | HR 90 | Ht 60.0 in | Wt 125.0 lb

## 2018-11-24 DIAGNOSIS — Z23 Encounter for immunization: Secondary | ICD-10-CM | POA: Diagnosis not present

## 2018-11-24 DIAGNOSIS — Z01419 Encounter for gynecological examination (general) (routine) without abnormal findings: Secondary | ICD-10-CM | POA: Diagnosis not present

## 2018-11-24 DIAGNOSIS — Z1239 Encounter for other screening for malignant neoplasm of breast: Secondary | ICD-10-CM

## 2018-11-24 DIAGNOSIS — Z87898 Personal history of other specified conditions: Secondary | ICD-10-CM

## 2018-11-24 DIAGNOSIS — Z124 Encounter for screening for malignant neoplasm of cervix: Secondary | ICD-10-CM

## 2018-11-24 MED ORDER — TRIAMCINOLONE ACETONIDE 0.1 % EX OINT: 1.0000 "application " | TOPICAL_OINTMENT | Freq: Two times a day (BID) | CUTANEOUS | 0 refills | Status: AC

## 2018-11-26 ENCOUNTER — Encounter: Payer: Self-pay | Admitting: Certified Nurse Midwife

## 2018-11-26 LAB — IGP,RFX APTIMA HPV ALL PTH

## 2018-12-18 ENCOUNTER — Ambulatory Visit
Admission: RE | Admit: 2018-12-18 | Discharge: 2018-12-18 | Disposition: A | Discharge status: Home / Self Care | Payer: Managed Care, Other (non HMO) | Source: Ambulatory Visit | Attending: Certified Nurse Midwife | Admitting: Certified Nurse Midwife

## 2018-12-18 DIAGNOSIS — Z1239 Encounter for other screening for malignant neoplasm of breast: Secondary | ICD-10-CM | POA: Diagnosis present

## 2019-11-30 ENCOUNTER — Ambulatory Visit: Payer: Managed Care, Other (non HMO) | Admitting: Certified Nurse Midwife

## 2019-12-01 ENCOUNTER — Other Ambulatory Visit: Payer: Self-pay

## 2019-12-01 ENCOUNTER — Encounter: Payer: Self-pay | Admitting: Certified Nurse Midwife

## 2019-12-01 ENCOUNTER — Ambulatory Visit (INDEPENDENT_AMBULATORY_CARE_PROVIDER_SITE_OTHER): Payer: Managed Care, Other (non HMO) | Admitting: Certified Nurse Midwife

## 2019-12-01 VITALS — BP 110/70 | HR 77 | Ht 60.0 in | Wt 124.0 lb

## 2019-12-01 DIAGNOSIS — Z124 Encounter for screening for malignant neoplasm of cervix: Secondary | ICD-10-CM

## 2019-12-01 DIAGNOSIS — Z01419 Encounter for gynecological examination (general) (routine) without abnormal findings: Secondary | ICD-10-CM

## 2019-12-01 DIAGNOSIS — Z1231 Encounter for screening mammogram for malignant neoplasm of breast: Secondary | ICD-10-CM

## 2019-12-01 DIAGNOSIS — R7989 Other specified abnormal findings of blood chemistry: Secondary | ICD-10-CM

## 2019-12-01 DIAGNOSIS — R202 Paresthesia of skin: Secondary | ICD-10-CM

## 2019-12-01 MED ORDER — MEASLES, MUMPS & RUBELLA VAC IJ SOLR: 0.5000 mL | Freq: Once | INTRAMUSCULAR | 0 refills | Status: AC

## 2019-12-01 NOTE — Progress Notes (Signed)
Gynecology Annual Exam  PCP: Patient, No Pcp Per  Chief Complaint:  Chief Complaint  Patient presents with  . Gynecologic Exam    wants to talk about MMR    History of Present Illness:Sonia Graves is a 53 year old Caucasian/White female , G 3 P 2 0 1 2 , who presents for her annual exam. She is not having any gyn concerns. Reports having burning and tingling in her hand and legs occasionally. This occurs when she awakes form sleeping.  Her menses are irregular, are every 24-60+ days, and last 4-5 days with a  medium flow. Mild hot flashes have abated. She does admit to some premenstraul headaches, breast tenderness with menses. No IMB. Her LMP was 11/05/2019 and was a lighter flow.  She denies dysmenorrhea.  The patient's past medical history is notable for a history of glucose intolerance and gestational diabetes.. Her last hemoglobin A1C in October 2020 was 5.6%. She also has seborrheic dermatitis Since her last annual GYN exam dated 11/24/2018, she has received the first dose of Shingrix and she has had her flu vaccine in October.  She had a colonoscopy for a family history of colon polyps in 10/04/2017. Had one hyperplastic polyp removed.  Her next colonoscopy is due in 5? years.  She is sexually active. She is currently using a vasectomy for contraception.  Her most recent pap smear was obtained 11/24/18 and was NIL. Her most recent mammogram obtained on 12/18/2018 was normal Birad 1 d There is a positive history of breast cancer in her maternal cousin and maternal aunt . Genetic testing has been done. The patient had MYRISK testing 2017 that was negative. Patient's IBIS risk calculated in 2017 for breast cancer is 13.4%.  There is a family history of ovarian cancer in her paternal aunt. Genetic testing has been done. The patient  tested negative for MYRISK.  The patient does do occasional self breast exams.  The patient does not smoke.  The patient does drink infrequently.   The patient does not use illegal drugs.  The patient has been exercising by walking. Trying to incorporate more exercise into her routine. The patient does get adequate calcium in her diet. She had a recent cholesterol screen in 2019 that was borderline elevated with a normal HDL=81 and an elevated LDL=161 She also found out she was not immune to rubeola and mumps last year. Had a RX for the MMR injection, but she misplaced it and would like another RX for a MMR.    The patient denies current symptoms of depression.    Review of Systems: Review of Systems  Constitutional: Negative for chills, fever and weight loss.  HENT: Negative for congestion, sinus pain and sore throat.   Eyes: Negative for blurred vision and pain.  Respiratory: Negative for hemoptysis, shortness of breath and wheezing.   Cardiovascular: Negative for chest pain, palpitations and leg swelling.  Gastrointestinal: Negative for abdominal pain, blood in stool, diarrhea, heartburn, nausea and vomiting.  Genitourinary: Negative for dysuria, frequency, hematuria and urgency.       Positive for irregular menses  Musculoskeletal: Negative for back pain, joint pain and myalgias.  Skin: Negative for itching and rash.  Neurological: Negative for dizziness, tingling and headaches.  Endo/Heme/Allergies: Negative for environmental allergies and polydipsia. Does not bruise/bleed easily.       Negative for hirsutism   Psychiatric/Behavioral: Negative for depression. The patient is not nervous/anxious and does not have insomnia.  Breasts: positive for tenderness  Past Medical History:  Past Medical History:  Diagnosis Date  . Anxiety   . Esophageal reflux   . Family history of breast cancer    IBIS risk 13.4% by [facility]   . Family history of ovarian cancer   . Genetic testing of female 08/2016   MyRisk Negative  . Goiter   . History of abnormal cervical Pap smear 2012   ascus  . History of kidney stones   . Inflammatory  disease of cervix, vagina, and vulva 2012  . Oligomenorrhea   . Prediabetes     Past Surgical History:  Past Surgical History:  Procedure Laterality Date  . COLONOSCOPY  2013   had heme positive FIT test  . COLONOSCOPY WITH PROPOFOL N/A 10/04/2017   Procedure: COLONOSCOPY WITH PROPOFOL;  Surgeon: Lollie Sails, MD;  Location: [facility] ENDOSCOPY;  Service: Endoscopy;  Laterality: N/A;  . DILATION AND CURETTAGE OF UTERUS    . LITHOTRIPSY    . WISDOM TOOTH EXTRACTION      Family History:  Family History  Problem Relation Age of Onset  . Breast cancer Maternal Aunt 61       died from breast cancer  . Diabetes Maternal Aunt        TYPE2  . Heart disease Maternal Aunt   . Breast cancer Cousin 60       maternal  . Diabetes Cousin        TYPE1  . Heart disease Mother        mitral valve replacement  . Hypertension Mother   . Thyroid disease Mother   . Squamous cell carcinoma Mother 27  . Cancer Mother 33       BOWEN'S DISEASE  . Prostate cancer Father 73  . Esophageal cancer Maternal Grandmother   . Colon cancer Paternal Uncle 93  . Ovarian cancer Paternal Aunt 8       + BRCA 2  . Pancreatic cancer Cousin        son of aunt with ovarian cancer  . Diabetes Maternal Aunt        TYPE2  . Diabetes Maternal Aunt        TYPE2  . Melanoma Cousin        daughter of aunt with ovarian cancer    Social History:  Social History   Socioeconomic History  . Marital status: Married    Spouse name: Not on file  . Number of children: 2  . Years of education: 16  . Highest education level: Bachelor's degree (e.g., BA, AB, BS)  Occupational History  . Occupation: Homemaker/ Part time IT  Social Needs  . Financial resource strain: Not on file  . Food insecurity    Worry: Not on file    Inability: Not on file  . Transportation needs    Medical: Not on file    Non-medical: Not on file  Tobacco Use  . Smoking status: Never Smoker  . Smokeless tobacco: Never Used  Substance  and Sexual Activity  . Alcohol use: Yes    Comment: occasional  . Drug use: No  . Sexual activity: Yes    Birth control/protection: Other-see comments    Comment: vasectomy   Lifestyle  . Physical activity    Days per week: Not on file    Minutes per session: Not on file  . Stress: Not on file  Relationships  . Social connections    Talks on phone: Not on file  Gets together: Not on file    Attends religious service: Not on file    Active member of club or organization: Not on file    Attends meetings of clubs or organizations: Not on file    Relationship status: Not on file  . Intimate partner violence    Fear of current or ex partner: Not on file    Emotionally abused: Not on file    Physically abused: Not on file    Forced sexual activity: Not on file  Other Topics Concern  . Not on file  Social History Narrative  . Not on file    Allergies:  No Known Allergies  Medications: Current Outpatient Medications on File Prior to Visit  Medication Sig Dispense Refill  . Clocortolone Pivalate (CLODERM) 0.1 % cream     . triamcinolone ointment (KENALOG) 0.1 % Apply 1 application topically 2 (two) times daily. 30 g 0  . valACYclovir (VALTREX) 500 MG tablet TK UTD IN OFFICE     No current facility-administered medications on file prior to visit.    Vitamin D3 1000 IU daily   Physical Exam Vitals: BP 110/70   Pulse 77   Ht 5' (1.524 m)   Wt 124 lb (56.2 kg)   LMP 11/05/2019 (Exact Date)   BMI 24.22 kg/m   General: WF in NAD HEENT: normocephalic, anicteric Neck: no thyroid enlargement, no palpable nodules, no cervical lymphadenopathy  Pulmonary: No increased work of breathing, CTAB Cardiovascular: RRR, without murmur  Breast: Breast symmetrical, no tenderness, no palpable nodules or masses, no skin or nipple retraction present, no nipple discharge.  No axillary, infraclavicular or supraclavicular lymphadenopathy. Abdomen: Soft, non-tender, non-distended.  Umbilicus  without lesions.  No hepatomegaly or masses palpable. No evidence of hernia. Genitourinary:  External: NO erythema or lesions  Normal urethral meatus, normal Bartholin's and Skene's glands.    Vagina: Normal vaginal mucosa, no evidence of prolapse.    Cervix: Grossly normal in appearance, no bleeding, non-tender  Uterus: Anteverted, normal size, shape, and consistency, mobile, and non-tender  Adnexa: No adnexal masses, non-tender  Rectal: deferred  Lymphatic: no evidence of inguinal lymphadenopathy Extremities: no edema, erythema, or tenderness Neurologic: Grossly intact Psychiatric: mood appropriate, affect full     Assessment: 53 y.o. annual gyn exam. Perimenopausal bleeding pattern Paresthesias in hands and legs-R/O vitamin B12 deficiency  Plan:  1) Breast cancer screening - recommend monthly self breast exams and annual screening mammograms. Mammogram was ordered today. Patient to call for appt at [facility]  2) Colonoscopy-UTD. Next due in 5? years  3) Cervical cancer screening - Pap was done. Desires yearly Pap smears.  4) Osteoporosis-discussed calcium and vitamin D3 requirements. DIscussed perimenopausal bleeding changes  5)  RX for MMR given. Labs today: Vitamin B12 and vitamin D levels   6) RTO 1 year and prn.   Dalia Heading, CNM

## 2019-12-02 ENCOUNTER — Telehealth: Payer: Self-pay | Admitting: Certified Nurse Midwife

## 2019-12-02 LAB — VITAMIN D 25 HYDROXY (VIT D DEFICIENCY, FRACTURES): Vit D, 25-Hydroxy: 24.9 ng/mL — ABNORMAL LOW (ref 30.0–100.0)

## 2019-12-02 LAB — VITAMIN B12: Vitamin B-12: 559 pg/mL (ref 232–1245)

## 2019-12-02 NOTE — Telephone Encounter (Signed)
Left message regarding her lab results. Vitamin D3 is low. Needs to take 1000 IU vitamin D3 more regularly or increase to 2000 IU. Vitamin B12 level normal. Recommend discussing paresthesias with PCP for further evaluation. Sonia Graves, CNM

## 2019-12-09 LAB — IGP,RFX APTIMA HPV ALL PTH

## 2019-12-09 LAB — HPV APTIMA: HPV Aptima: NEGATIVE

## 2019-12-11 ENCOUNTER — Other Ambulatory Visit: Payer: Self-pay

## 2019-12-11 DIAGNOSIS — Z20822 Contact with and (suspected) exposure to covid-19: Secondary | ICD-10-CM

## 2019-12-13 LAB — NOVEL CORONAVIRUS, NAA: SARS-CoV-2, NAA: NOT DETECTED

## 2019-12-15 NOTE — Progress Notes (Signed)
Patient called.  Discussed Pap smear results: ASCUS with negative HRHPV . Recommend repeating in 1 year. Dalia Heading, CNM

## 2019-12-16 ENCOUNTER — Ambulatory Visit: Payer: Managed Care, Other (non HMO) | Attending: Internal Medicine

## 2019-12-16 ENCOUNTER — Other Ambulatory Visit: Payer: Self-pay

## 2019-12-16 DIAGNOSIS — Z20822 Contact with and (suspected) exposure to covid-19: Secondary | ICD-10-CM

## 2019-12-18 LAB — NOVEL CORONAVIRUS, NAA: SARS-CoV-2, NAA: NOT DETECTED

## 2019-12-28 ENCOUNTER — Ambulatory Visit
Admission: RE | Admit: 2019-12-28 | Discharge: 2019-12-28 | Disposition: A | Discharge status: Home / Self Care | Payer: Managed Care, Other (non HMO) | Source: Ambulatory Visit | Attending: Certified Nurse Midwife | Admitting: Certified Nurse Midwife

## 2019-12-28 ENCOUNTER — Other Ambulatory Visit: Payer: Self-pay

## 2019-12-28 DIAGNOSIS — Z1231 Encounter for screening mammogram for malignant neoplasm of breast: Secondary | ICD-10-CM | POA: Insufficient documentation

## 2019-12-29 ENCOUNTER — Other Ambulatory Visit: Payer: Self-pay | Admitting: Certified Nurse Midwife

## 2019-12-29 DIAGNOSIS — R928 Other abnormal and inconclusive findings on diagnostic imaging of breast: Secondary | ICD-10-CM

## 2019-12-29 DIAGNOSIS — N6320 Unspecified lump in the left breast, unspecified quadrant: Secondary | ICD-10-CM

## 2020-10-21 IMAGING — MG DIGITAL SCREENING BILAT W/ TOMO W/ CAD
8 series · 8 of 24 positions shown · non-contrast
Comparison: Previous exam(s).

CLINICAL DATA: Screening.

EXAM:
DIGITAL SCREENING BILATERAL MAMMOGRAM WITH TOMO AND CAD

[L MLO synth-2D]
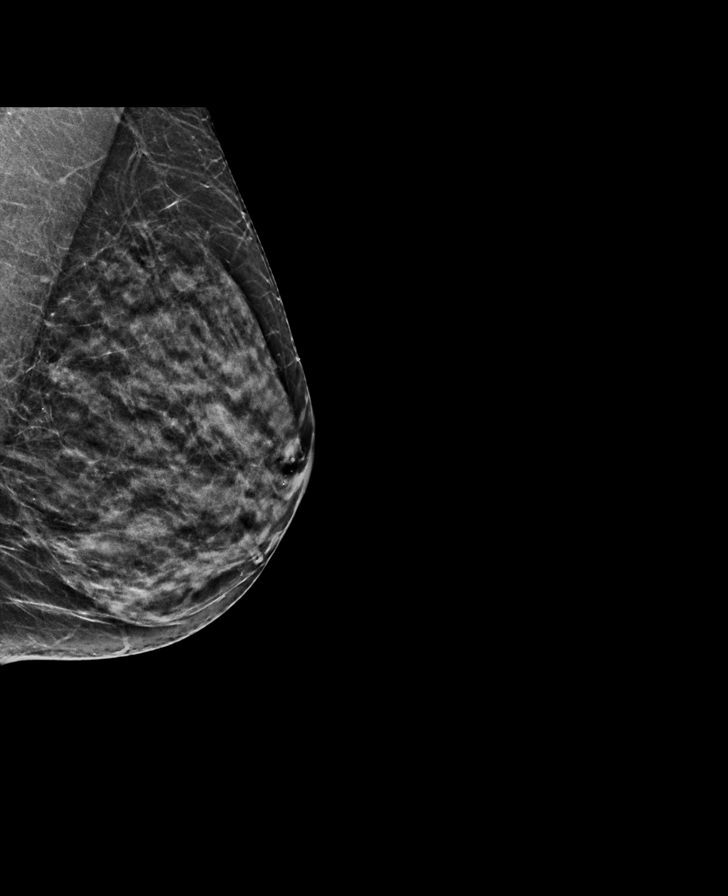

[R CC synth-2D]
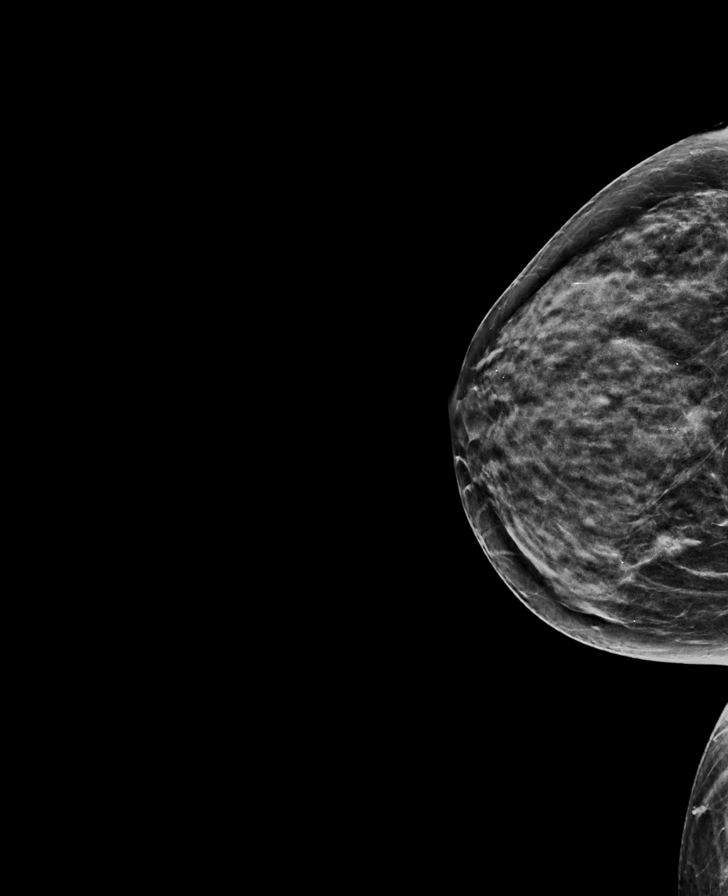

[L CC synth-2D]
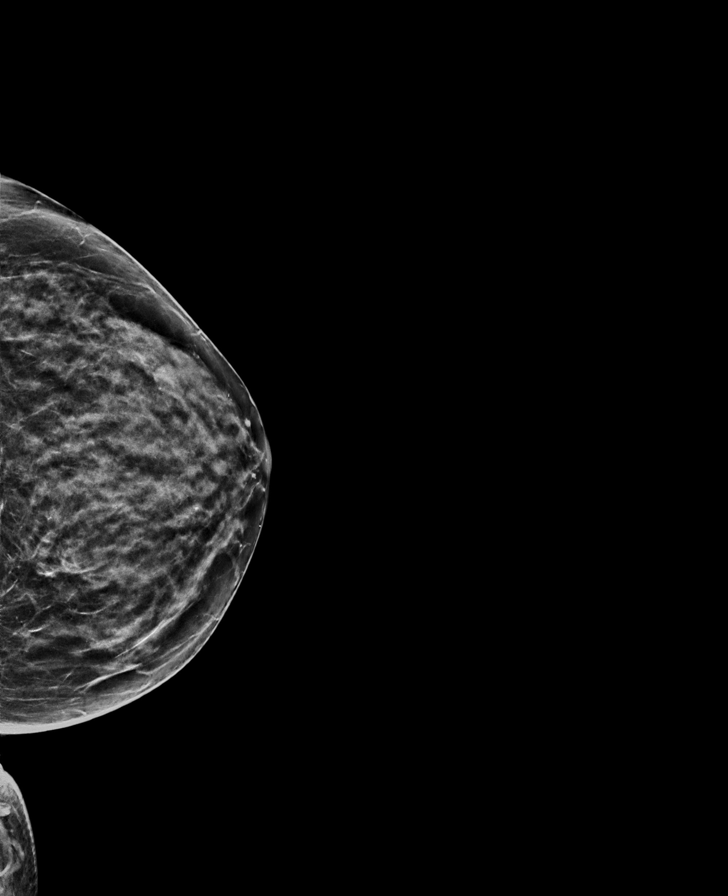

[R MLO synth-2D]
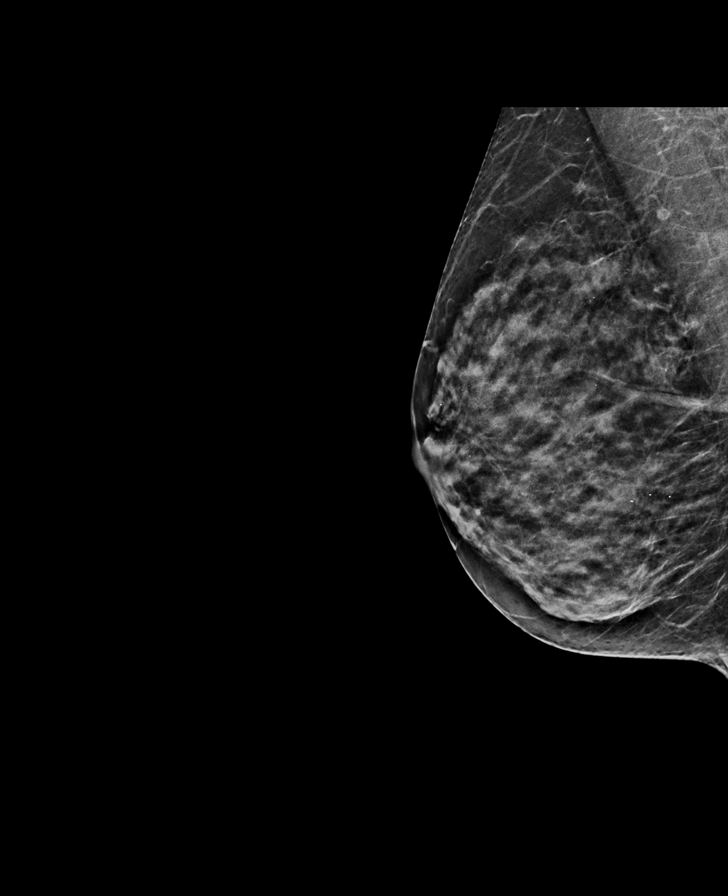

[L CC tomo · tomo slice 28/55.0]
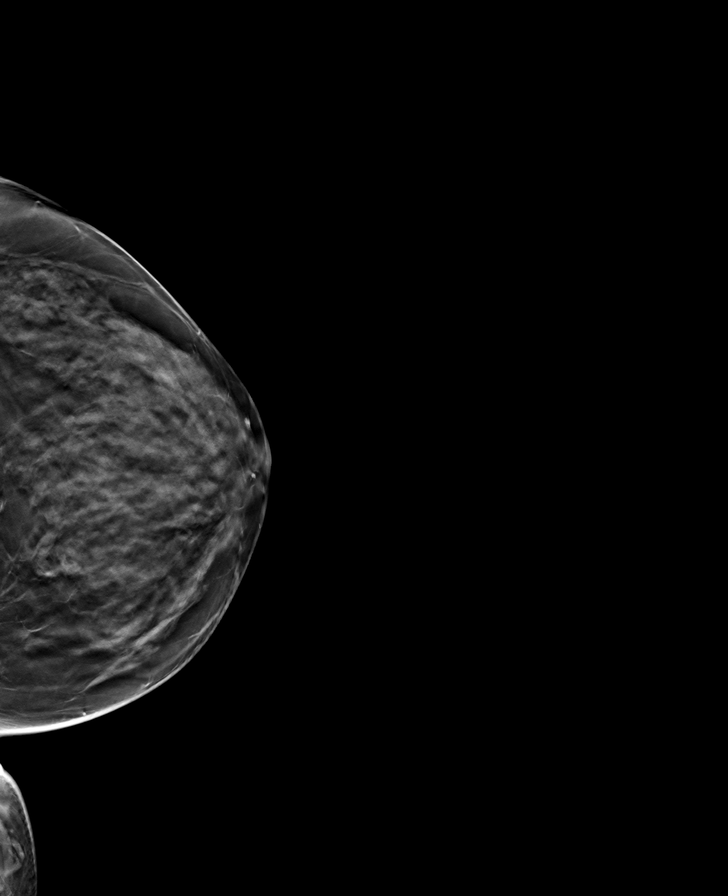

[R CC tomo · tomo slice 29/58.0]
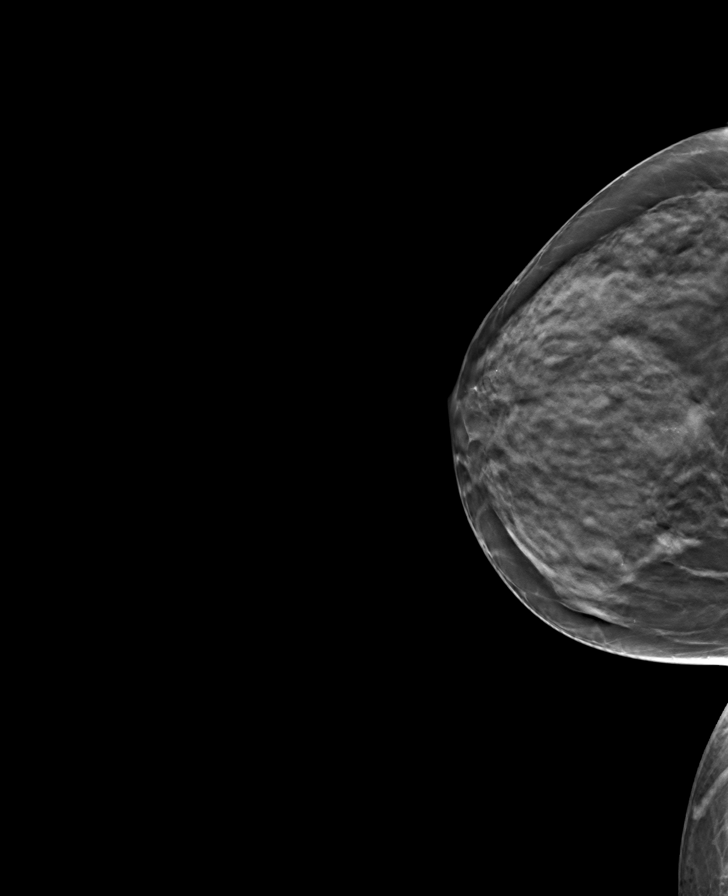

[L MLO tomo · tomo slice 27/52.0]
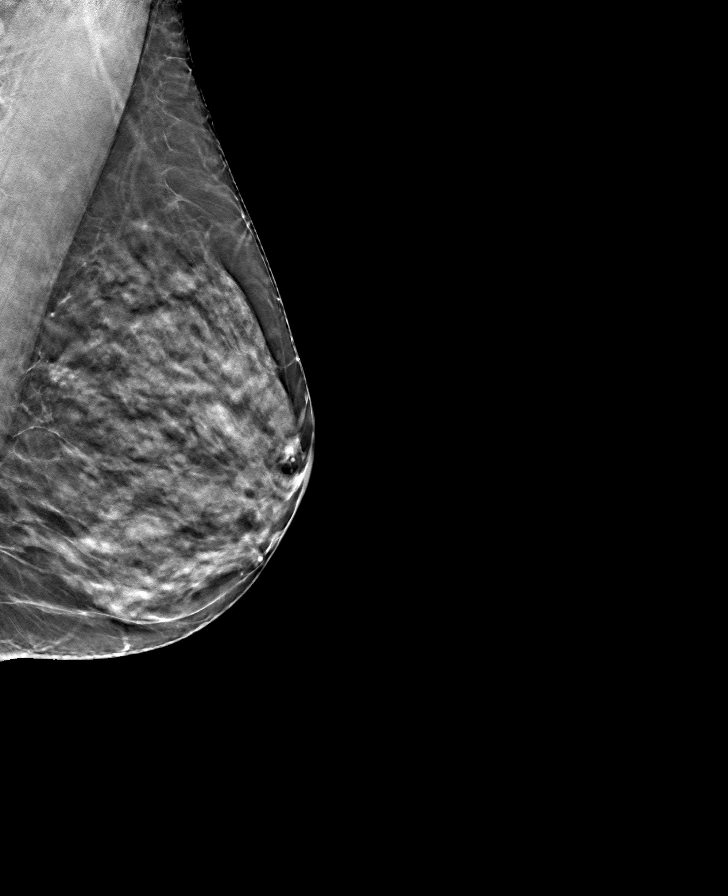

[R MLO tomo · tomo slice 26/51.0]
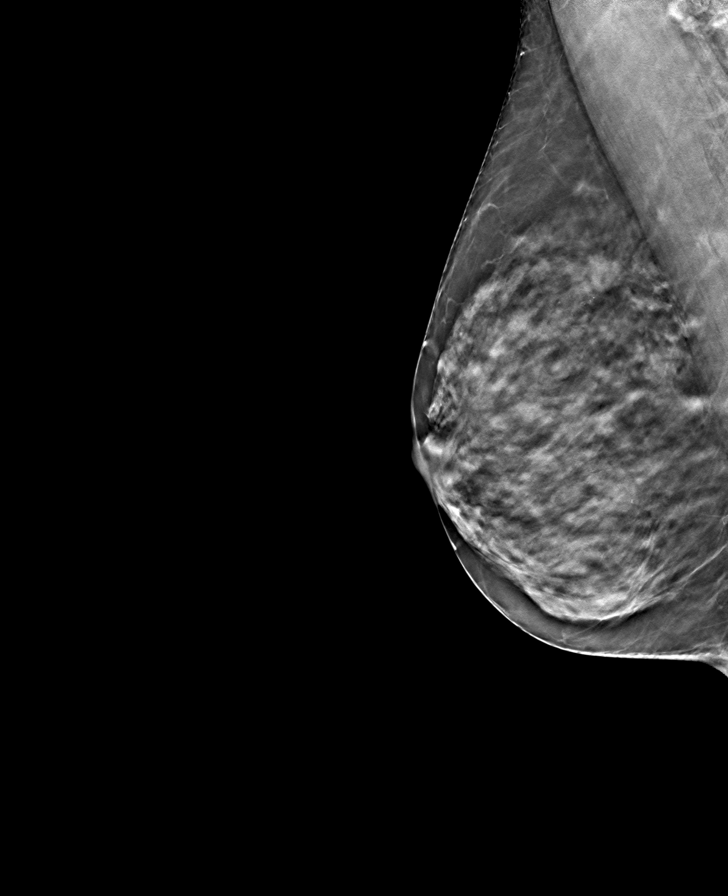

[8 of 24 positions shown; findings below may reference images not displayed]

ACR Breast Density Category d: The breast tissue is extremely dense,
which lowers the sensitivity of mammography.
FINDINGS: In the left breast, a possible mass warrants further evaluation. In
the right breast, no findings suspicious for malignancy. Images were
processed with CAD.
IMPRESSION: Further evaluation is suggested for possible mass in the left
breast.

RECOMMENDATION:
Ultrasound of the left breast. (Code:[id number])

The patient will be contacted regarding the findings, and additional
imaging will be scheduled.

BI-RADS CATEGORY  0: Incomplete. Need additional imaging evaluation
and/or prior mammograms for comparison.

## 2020-12-07 ENCOUNTER — Ambulatory Visit (INDEPENDENT_AMBULATORY_CARE_PROVIDER_SITE_OTHER): Payer: Managed Care, Other (non HMO) | Admitting: Obstetrics and Gynecology

## 2020-12-07 ENCOUNTER — Encounter: Payer: Self-pay | Admitting: Obstetrics and Gynecology

## 2020-12-07 ENCOUNTER — Other Ambulatory Visit: Payer: Self-pay

## 2020-12-07 VITALS — BP 122/74 | Ht 60.0 in | Wt 128.0 lb

## 2020-12-07 DIAGNOSIS — Z1339 Encounter for screening examination for other mental health and behavioral disorders: Secondary | ICD-10-CM

## 2020-12-07 DIAGNOSIS — Z1331 Encounter for screening for depression: Secondary | ICD-10-CM

## 2020-12-07 DIAGNOSIS — Z01419 Encounter for gynecological examination (general) (routine) without abnormal findings: Secondary | ICD-10-CM | POA: Diagnosis not present

## 2020-12-07 DIAGNOSIS — Z124 Encounter for screening for malignant neoplasm of cervix: Secondary | ICD-10-CM | POA: Diagnosis not present

## 2020-12-07 DIAGNOSIS — Z1231 Encounter for screening mammogram for malignant neoplasm of breast: Secondary | ICD-10-CM

## 2020-12-07 NOTE — Progress Notes (Signed)
Routine Annual Gynecology Examination   PCP: Pcp, No  Chief Complaint  Patient presents with  . Annual Exam   History of Present Illness: Patient is a 54 y.o. [id number] presents for annual exam. The patient has no complaints today.   Last menstrual period was in June or July of this year.   Menopausal symptoms: reports more frequent hot flashes than before. Nothing too hard to deal with.   Breast symptoms: denies  Last pap smear: 1 years ago.  Result ASCUS-HPV negative  Last mammogram: 1 year ago.  Result: BiRads 0.  She did not have follow up with [facility]. So, she went to see Dr. Fleet Contras who did an ultrasound who reassured the patient.  He called his findings BiRads 2 and recommended screening mammogram in one year.  Last colonoscopy: 2018.  Unclear what the follow up recommendation. The reports states that several sessile polyps were found.   Past Medical History:  Diagnosis Date  . Anxiety   . Esophageal reflux   . Family history of breast cancer    IBIS risk 13.4% by [facility]   . Family history of ovarian cancer   . Genetic testing of female 08/2016   MyRisk Negative  . Goiter   . History of abnormal cervical Pap smear 2012   ascus  . History of kidney stones   . Inflammatory disease of cervix, vagina, and vulva 2012  . Oligomenorrhea   . Prediabetes     Past Surgical History:  Procedure Laterality Date  . COLONOSCOPY  2013   had heme positive FIT test  . COLONOSCOPY WITH PROPOFOL N/A 10/04/2017   Procedure: COLONOSCOPY WITH PROPOFOL;  Surgeon: Lollie Sails, MD;  Location: [facility] ENDOSCOPY;  Service: Endoscopy;  Laterality: N/A;  . DILATION AND CURETTAGE OF UTERUS    . LITHOTRIPSY    . WISDOM TOOTH EXTRACTION      Prior to Admission medications   Medication Sig Start Date End Date Taking? Authorizing Provider  valACYclovir (VALTREX) 500 MG tablet TK UTD IN OFFICE 09/02/19  Yes [provider]  Clocortolone Pivalate (CLODERM) 0.1 % cream  07/29/17    [provider]   Allergies: No Known Allergies  Obstetric History: [id number]  Social History   Socioeconomic History  . Marital status: Married    Spouse name: Not on file  . Number of children: 2  . Years of education: 16  . Highest education level: Bachelor's degree (e.g., BA, AB, BS)  Occupational History  . Occupation: Homemaker/ Part time IT  Tobacco Use  . Smoking status: Never Smoker  . Smokeless tobacco: Never Used  Vaping Use  . Vaping Use: Never used  Substance and Sexual Activity  . Alcohol use: Yes    Comment: occasional  . Drug use: No  . Sexual activity: Yes    Birth control/protection: Other-see comments    Comment: vasectomy   Other Topics Concern  . Not on file  Social History Narrative  . Not on file   Social Determinants of Health   Financial Resource Strain:   . Difficulty of Paying Living Expenses: Not on file  Food Insecurity:   . Worried About Charity fundraiser in the Last Year: Not on file  . Ran Out of Food in the Last Year: Not on file  Transportation Needs:   . Lack of Transportation (Medical): Not on file  . Lack of Transportation (Non-Medical): Not on file  Physical Activity:   . Days of Exercise per  Week: Not on file  . Minutes of Exercise per Session: Not on file  Stress:   . Feeling of Stress : Not on file  Social Connections:   . Frequency of Communication with Friends and Family: Not on file  . Frequency of Social Gatherings with Friends and Family: Not on file  . Attends Religious Services: Not on file  . Active Member of Clubs or Organizations: Not on file  . Attends Archivist Meetings: Not on file  . Marital Status: Not on file  Intimate Partner Violence:   . Fear of Current or Ex-Partner: Not on file  . Emotionally Abused: Not on file  . Physically Abused: Not on file  . Sexually Abused: Not on file    Family History  Problem Relation Age of Onset  . Breast cancer Maternal Aunt 19       died  from breast cancer  . Diabetes Maternal Aunt        TYPE2  . Heart disease Maternal Aunt   . Breast cancer Cousin 60       maternal  . Diabetes Cousin        TYPE1  . Heart disease Mother        mitral valve replacement  . Hypertension Mother   . Thyroid disease Mother   . Squamous cell carcinoma Mother 82  . Cancer Mother 74       BOWEN'S DISEASE  . Prostate cancer Father 71  . Esophageal cancer Maternal Grandmother   . Colon cancer Paternal Uncle 20  . Ovarian cancer Paternal Aunt 31       + BRCA 2  . Pancreatic cancer Cousin        son of aunt with ovarian cancer  . Diabetes Maternal Aunt        TYPE2  . Diabetes Maternal Aunt        TYPE2  . Melanoma Cousin        daughter of aunt with ovarian cancer   Review of Systems  Constitutional: Negative.   HENT: Negative.   Eyes: Negative.   Respiratory: Negative.   Cardiovascular: Negative.   Gastrointestinal: Negative.   Genitourinary: Negative.   Musculoskeletal: Negative.   Skin: Negative.   Neurological: Negative.   Psychiatric/Behavioral: Negative.      Physical Exam Vitals: BP 122/74   Ht 5' (1.524 m)   Wt 128 lb (58.1 kg)   BMI 25.00 kg/m   Physical Exam Constitutional:      General: She is not in acute distress.    Appearance: Normal appearance. She is well-developed.  Genitourinary:     Pelvic exam was performed with patient in the lithotomy position.     Vulva, urethra, bladder and uterus normal.     No inguinal adenopathy present in the right or left side.    No signs of injury in the vagina.     No vaginal discharge, erythema, tenderness or bleeding.     No cervical motion tenderness, discharge, lesion or polyp.     Uterus is mobile.     Uterus is not enlarged or tender.     No uterine mass detected.    Uterus is anteverted.     No right or left adnexal mass present.     Right adnexa not tender or full.     Left adnexa not tender or full.  HENT:     Head: Normocephalic and atraumatic.   Eyes:     General: No  scleral icterus.    Conjunctiva/sclera: Conjunctivae normal.  Neck:     Thyroid: No thyromegaly.  Cardiovascular:     Rate and Rhythm: Normal rate and regular rhythm.     Heart sounds: No murmur heard.  No friction rub. No gallop.   Pulmonary:     Effort: Pulmonary effort is normal. No respiratory distress.     Breath sounds: Normal breath sounds. No wheezing or rales.  Chest:     Breasts:        Right: No inverted nipple, mass, nipple discharge, skin change or tenderness.        Left: No inverted nipple, mass, nipple discharge, skin change or tenderness.  Abdominal:     General: Bowel sounds are normal. There is no distension.     Palpations: Abdomen is soft. There is no mass.     Tenderness: There is no abdominal tenderness. There is no guarding or rebound.  Musculoskeletal:        General: No swelling or tenderness. Normal range of motion.     Cervical back: Normal range of motion and neck supple.  Lymphadenopathy:     Cervical: No cervical adenopathy.     Lower Body: No right inguinal adenopathy. No left inguinal adenopathy.  Neurological:     General: No focal deficit present.     Mental Status: She is alert and oriented to person, place, and time.     Cranial Nerves: No cranial nerve deficit.  Skin:    General: Skin is warm and dry.     Findings: No erythema or rash.  Psychiatric:        Mood and Affect: Mood normal.        Behavior: Behavior normal.        Judgment: Judgment normal.     Female chaperone present for pelvic and breast  portions of the physical exam  Results: AUDIT Questionnaire (screen for alcoholism): 1 PHQ-9: 2   Assessment and Plan:  54 y.o. [id number] female here for routine annual gynecologic examination  Plan: Problem List Items Addressed This Visit    None    Visit Diagnoses    Women's annual routine gynecological examination    -  Primary   Relevant Orders   IGP, Aptima HPV, rfx 16/18,45   MM 3D SCREEN BREAST  BILATERAL   Screening for depression       Screening for alcoholism       Pap smear for cervical cancer screening       Relevant Orders   IGP, Aptima HPV, rfx 16/18,45   Encounter for screening mammogram for malignant neoplasm of breast       Relevant Orders   MM 3D SCREEN BREAST BILATERAL      Screening: -- Blood pressure screen normal -- Colonoscopy - not due -- Mammogram - due. Patient to call [facility] to arrange. She understands that it is her responsibility to arrange this. -- Weight screening: normal -- Depression screening negative (PHQ-9) -- Nutrition: normal -- cholesterol screening: per PCP -- osteoporosis screening: not due -- tobacco screening: not using -- alcohol screening: AUDIT questionnaire indicates low-risk usage. -- family history of breast cancer screening: done. not at high risk. -- no evidence of domestic violence or intimate partner violence. -- STD screening: gonorrhea/chlamydia NAAT not collected per patient request. -- pap smear collected per ASCCP guidelines  Prentice Docker, MD 12/07/2020 5:40 PM

## 2020-12-09 ENCOUNTER — Ambulatory Visit: Payer: Managed Care, Other (non HMO) | Admitting: Obstetrics and Gynecology

## 2020-12-09 ENCOUNTER — Telehealth: Payer: Self-pay | Admitting: Obstetrics and Gynecology

## 2020-12-09 NOTE — Telephone Encounter (Signed)
Patient states she is having trouble scheduling mammogram with [facility]  and is needing your assistance.

## 2020-12-10 LAB — IGP, APTIMA HPV, RFX 16/18,45: HPV Aptima: NEGATIVE

## 2020-12-12 NOTE — Telephone Encounter (Signed)
See patient message. I was not sure if she needed an order or not
